# Patient Record
Sex: Female | Born: 1975 | Race: Black or African American | Hispanic: No | Marital: Married | Smoking: Never smoker
Health system: Southern US, Community
[De-identification: ages and names within clinical notes are randomized; demographics above are authoritative.]

## PROBLEM LIST (undated history)

## (undated) ENCOUNTER — Inpatient Hospital Stay: Payer: Self-pay

## (undated) DIAGNOSIS — Z8619 Personal history of other infectious and parasitic diseases: Secondary | ICD-10-CM

## (undated) DIAGNOSIS — Z789 Other specified health status: Secondary | ICD-10-CM

## (undated) DIAGNOSIS — E079 Disorder of thyroid, unspecified: Secondary | ICD-10-CM

## (undated) HISTORY — DX: Disorder of thyroid, unspecified: E07.9

## (undated) HISTORY — PX: MYOMECTOMY: SHX85

## (undated) HISTORY — PX: LEEP: SHX91

## (undated) HISTORY — DX: Personal history of other infectious and parasitic diseases: Z86.19

---

## 2009-02-03 ENCOUNTER — Emergency Department: Payer: Self-pay | Admitting: Emergency Medicine

## 2009-02-06 ENCOUNTER — Other Ambulatory Visit: Payer: Self-pay | Admitting: Emergency Medicine

## 2011-01-17 ENCOUNTER — Inpatient Hospital Stay: Payer: Self-pay | Admitting: Obstetrics and Gynecology

## 2014-06-16 ENCOUNTER — Encounter: Payer: Self-pay | Admitting: Maternal & Fetal Medicine

## 2014-08-04 ENCOUNTER — Encounter
Admit: 2014-08-04 | Disposition: A | Discharge status: Home / Self Care | Payer: Self-pay | Attending: Maternal & Fetal Medicine | Admitting: Maternal & Fetal Medicine

## 2014-08-14 NOTE — Consult Note (Signed)
Referral Information:  Reason for Referral 39 yo gravida 3 para 2002 at 5273w4d gestation is referred by [facility]  regarding the patient's history of stillbirth at [redacted] weeks gestation and abruption at 7236 weeks gestation.   Referring Physician [facility]   Prenatal Hx US 05/27/2014 - 647w5d EDD 01/01/2015; small fibroid   Past Obstetrical Hx 1. 07/29/2009 - spontaneous vaginal delivery of stillborn female fetus at [redacted] weeks gestation.  True knot in cord. [facility] 2. 01/17/2011 - LTCS 5 lb 6 oz female infant for abruption at [redacted] weeks gestation.  Multiple dense adhesions.  [facility]   Home Medications: Medication Instructions Status  prenatal vitmain 1 tab(s)  once a day while breastfeeding Active   Allergies:   No Known Allergies:   Vital Signs/Notes:  Nursing Vital Signs: **Vital Signs.:   03-Mar-16 09:11  Vital Signs Type Routine  Temperature Temperature (F) 98.3  Celsius 36.8  Pulse Pulse 86  Respirations Respirations 18  Systolic BP Systolic BP 121  Diastolic BP (mmHg) Diastolic BP (mmHg) 73  Mean BP 89  Pulse Ox % Pulse Ox % 100  Pulse Ox Activity Level  At rest  Oxygen Delivery Room Air/ 21 %   Perinatal Consult:  PGyn Hx fibroids  LEEP  Myomectomies 10/30/2009; LEEP 2008   Past Medical History cont'd Fibroids   PSurg Hx Abdominal laparotomy and myomectomies 10/30/2009 for large fibroids;   FHx Mother - HTN, fibroids; Father - HTN; MGM - still birth; FOBs oldest bro with multiple fractures; his children with multiple fractures   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 history of stillbirth at 4630 weeks gestation associated with true know, but also abruption at 8136 weeks gestation  -- APS labs today -- ASA 81 mg daily starting at [redacted] weeks gestation -- [facility], [facility] as outlined below 2. history of large myomectomies, previous LTCS with dense adhesions -- patient is aware she is not a candidate for TOLAC   Plan:  Prenatal Diagnosis Options Level II US, [facility] at [redacted] weeks gestation   Ultrasound at what gestational ages Monthly > 28 weeks   Antepartum Testing Weekly, starting at [redacted] weeks gestation; twice weekly starting at 8632 weeks gestation   Delivery Mode Cesarean   Delivery at what gestational age [redacted] weeks, or sooner prn   Comment/Plan Thank you for allowing us to participate in her care    Total Time Spent with Patient 45 minutes   >50% of visit spent in couseling/coordination of care yes   Office Use Only [address]  Level 3 (40min) NEW office consult detailed   Coding Description: FETAL - 2nd/3rd TRIMESTER INDICATION(S).   History of placental abruption.   Intrauterine fetal demise > 22 weeks.   MATERNAL CONDITIONS/HISTORY INDICATION(S).   Fibroids.  Electronic Signatures: Lady DeutscherJames, Nakiesha Rumsey (MD)  (Signed 03-Mar-16 18:05)  Authored: Referral, Home Medications, Allergies, Vital Signs/Notes, Consult, Exam, Impression, Plan, Billing, Coding Description  Last Updated: 03-Mar-16 18:05 by Lady Deutscher (MD)

## 2014-10-13 ENCOUNTER — Ambulatory Visit
Admission: RE | Admit: 2014-10-13 | Discharge: 2014-10-13 | Disposition: A | Discharge status: Home / Self Care | Payer: No Typology Code available for payment source | Source: Ambulatory Visit | Attending: Maternal & Fetal Medicine | Admitting: Maternal & Fetal Medicine

## 2014-10-13 DIAGNOSIS — O09523 Supervision of elderly multigravida, third trimester: Secondary | ICD-10-CM | POA: Insufficient documentation

## 2014-10-13 DIAGNOSIS — Z3A32 32 weeks gestation of pregnancy: Secondary | ICD-10-CM | POA: Insufficient documentation

## 2014-10-13 DIAGNOSIS — Z8759 Personal history of other complications of pregnancy, childbirth and the puerperium: Secondary | ICD-10-CM

## 2014-10-13 DIAGNOSIS — O4410 Placenta previa with hemorrhage, unspecified trimester: Secondary | ICD-10-CM | POA: Insufficient documentation

## 2014-10-13 DIAGNOSIS — Z36 Encounter for antenatal screening of mother: Secondary | ICD-10-CM | POA: Insufficient documentation

## 2014-10-13 LAB — US OB FOLLOW UP

## 2014-10-13 NOTE — Progress Notes (Signed)
Ultrasound report from 10/13/2014 (printer dysfunction today) Indication: AMA, h/o abruption at 36 weeks.  ____________________________________________________________________________ History: Age: 39 years. [facility] 39 [facility] ____________________________________________________________________________ Dating: LMP: 04/02/2014 EDC: 01/07/2015 GA by LMP: 3251w5d Current Scan on: 10/13/2014 EDC: 01/03/2015 GA by current scan: 1258w2d Best Overall Assessment: 08/04/2014 EDC: 01/01/2015 Assessed GA: 4024w4d The calculation of the gestational age by current scan was based on BPD, HC, AC, FL and HUM. The Best Overall Assessment is based on an earlier assessment on 05/27/2014. ____________________________________________________________________________ Anatomy Scan: Singleton gestation. Biometry: BPD 69.4 mm  - 2827w6d (9229w1d to 8124w4d) HC 254.4 mm  - 2236w4d (4239w4d to 7774w5d) AC 247.9 mm  - 6946w0d (5058w2d to 6374w5d) FL 54.2 mm  - 6531w5d (8361w4d to 22110w5d) HUM 48.7 mm  - 3658w2d EFW (lbs/oz) 2 lbs 13 ozs EFW (g) 1267 g 41st%   Fetal heart activity: present. Fetal heart rate: 149 bpm.  Fetal presentation: cephalic.  Amniotic fluid: normal. AFI  14.3 cm. MVP 6.8 cm.  Placenta: placenta previa - posterior.   Fetal Anatomy: Head: Appears normal.  Brain: Appears normal.  Face: Appears normal.  Spine: Appears normal Neck / Skin: visualized previously.  Thorax: Appears normal.  Heart: Appears normal.  Abdominal Wall: visualized previously.  Gastrointestinal Tract: Appears normal.  Kidneys / Adrenal Glands: Appears normal.  Bladder: Appears normal.  Genitalia: Female fetus.  Extremities: visualized previously.  Skeleton: visualized previously.  ____________________________________________________________________________ Maternal Structures: Cervical length 32 mm. ____________________________________________________________________________ Report Summary: Impression: Single intrauterine  pregnancy with an estimated gestational age of [redacted] weeks 4 day(s).  Dating assigned by earliest available ultrasound, [facility]/2014; measurements at that exam reported as  8 weeks 5 day(s).  The patient was  noted to have an isolated fetal choroid plexus cyst. She has had normal cell free fetal DNA testing.    Today, she returns to follow up fetal growth and placental location due to history of placenta previa in the setting of prior cesarean delivery.   [facility] due to her history of stillbirth at [redacted] weeks gestation associated with a true knot in the cord and another pregnancy complicated by abruption at [redacted] weeks gestation.  She also has a history of large myomectomies and previous LTCS with dense adhesions.  She has been previously counseled that she is not a candidate for labor.  We addressed the possiblity of increased blood loss at the time of delivery due to history of multiple uterine surgeries, and placental previa.    Both transbdominal and endovaginal ultraosunds were performed to complete the study.  The interval fetal growth is appropriate. The fetal efw is 1267g (47%).  The follow up fetal anatomy appears normal.  The previously seen choroid plexus cyst is no longer identified.   The anterior placenta is ~.5 cm from the internal cervical os--consistent with a marginal previa.  The utero-placental interface appears normal. Although a focal accreta cannot be excluded, we do not see sonographic evidence of an accreta.  We performed both transabdominal and transvaginal imaging (with full bladder) in order to image the placenta.    At this time, we do not have any evidence of placenta accreta.    Recommend follow up ultrasound for fetal growth in 4 weeks due to her history of IUFD, abruption and marginal previa in the current pregnancy.  We will reassess the placenta again at the time of her follow up ultrasound.  CODING DESCRIPTION:  previous iufd,  previous abruption, previous cesarean delivery, previous myomectomies, marginal plaenta previa.  Recommendations:   Thank you for allowing [place] to participate in her care.

## 2014-10-20 ENCOUNTER — Encounter: Payer: Self-pay | Admitting: *Deleted

## 2014-10-20 ENCOUNTER — Observation Stay
Admission: EM | Admit: 2014-10-20 | Discharge: 2014-10-20 | Disposition: A | Discharge status: Home / Self Care | Payer: No Typology Code available for payment source | Attending: Obstetrics & Gynecology | Admitting: Obstetrics & Gynecology

## 2014-10-20 DIAGNOSIS — Z3A29 29 weeks gestation of pregnancy: Secondary | ICD-10-CM | POA: Insufficient documentation

## 2014-10-20 DIAGNOSIS — O368130 Decreased fetal movements, third trimester, not applicable or unspecified: Principal | ICD-10-CM | POA: Insufficient documentation

## 2014-10-20 DIAGNOSIS — O479 False labor, unspecified: Secondary | ICD-10-CM | POA: Diagnosis present

## 2014-10-20 HISTORY — DX: Other specified health status: Z78.9

## 2014-10-20 NOTE — H&P (Signed)
Obstetrics Admission History & Physical   Non-stress Test   HPI:  39 y.o. G3P0100 @ 238w4d (01/01/2015, Alternate EDD Entry). Admitted on 10/20/2014:   Patient Active Problem List   Diagnosis Date Noted  . Irregular contractions 10/20/2014     Presents for NR NST in office, h/o abruption, DFM.Marland Kitchen.  [facility]  PMHx:  Past Medical History  Diagnosis Date  . Medical history non-contributory    PSHx:  Past Surgical History  Procedure Laterality Date  . Myomectomy     Medications:  Prescriptions prior to admission  Medication Sig Dispense Refill Last Dose  . aspirin 81 MG tablet Take 81 mg by mouth daily.   Taking  . Prenatal Vit-Fe Fumarate-FA (PRENATAL MULTIVITAMIN) TABS tablet Take 1 tablet by mouth daily at 12 noon.      Allergies: has No Known Allergies. OBHx:  OB History  Gravida Para Term Preterm AB SAB TAB Ectopic Multiple Living  3 2  1           # Outcome Date GA Lbr Len/2nd Weight Sex Delivery Anes PTL Lv  3 Current           2 Para 01/25/11    F CS-Unspec        Complications: Abruptio Placenta  1 Preterm 07/20/09    M Vag-Spont   FD     ZOX:[id number]/[id number]:Negative/unremarkable except as detailed in HPI. Soc Hx: Pregnancy welcomed  Objective:   Filed Vitals:   10/20/14 1625  BP: 121/74  Pulse: 73  Temp: 98.3 F (36.8 C)  Resp: 18   General: Well nourished, well developed female in no acute distress.  Skin: Warm and dry.  Cardiovascular:Regular rate and rhythm. Respiratory: Clear to auscultation bilateral. Normal respiratory effort Abdomen: no pain Neuro/Psych: Normal mood and affect.   EFM:FHR: 130 bpm, variability: moderate,  accelerations:  Present,  decelerations:  Absent Toco: None  Assessment & Plan:   39 y.o. G3P0100 @ 3638w4d, Admitted on 10/20/2014: NST REACTIVE.    Discharge Home and Fetal Wellbeing Reassuring

## 2014-10-20 NOTE — OB Triage Note (Signed)
Sent from office for nonreactive NST

## 2014-10-20 NOTE — Discharge Summary (Signed)
Patient discharged home, discharge instructions given, patient states understanding, pt left floor in stable condition, denies any other needs at this time

## 2014-10-20 NOTE — Discharge Summary (Signed)
Gynecology Discharge Summary Date of Admission: 10/20/2014 Date of Discharge: 10/20/2014  The patient was admitted, as scheduled, and underwent a NST procedure; [facility] for full details.   NST Reactive; reassuring.    Medication List    TAKE these medications        aspirin 81 MG tablet  Take 81 mg by mouth daily.     prenatal multivitamin Tabs tablet  Take 1 tablet by mouth daily at 12 noon.       Future Appointments Date Time Provider Department Center  11/10/2014 8:00 AM [facility] US 1 [facility]  Also, [facility]  Discharge Dx: h/o Abruption, DFM.  Annamarie MajorPaul TRUE Garciamartinez, MD [facility]

## 2014-10-21 ENCOUNTER — Observation Stay
Admission: EM | Admit: 2014-10-21 | Discharge: 2014-10-22 | Disposition: A | Discharge status: Home / Self Care | Payer: No Typology Code available for payment source | Attending: Obstetrics & Gynecology | Admitting: Obstetrics & Gynecology

## 2014-10-21 DIAGNOSIS — O368190 Decreased fetal movements, unspecified trimester, not applicable or unspecified: Principal | ICD-10-CM | POA: Insufficient documentation

## 2014-10-22 DIAGNOSIS — O368190 Decreased fetal movements, unspecified trimester, not applicable or unspecified: Secondary | ICD-10-CM | POA: Diagnosis not present

## 2014-10-22 NOTE — OB Triage Note (Signed)
Patient presented from ED stating she hasn't felt her baby move since around dinner time.

## 2014-11-10 ENCOUNTER — Ambulatory Visit
Admission: RE | Admit: 2014-11-10 | Discharge: 2014-11-10 | Disposition: A | Discharge status: Home / Self Care | Payer: No Typology Code available for payment source | Source: Ambulatory Visit | Attending: Maternal & Fetal Medicine | Admitting: Maternal & Fetal Medicine

## 2014-11-10 DIAGNOSIS — O09299 Supervision of pregnancy with other poor reproductive or obstetric history, unspecified trimester: Secondary | ICD-10-CM | POA: Diagnosis present

## 2014-11-10 DIAGNOSIS — O09529 Supervision of elderly multigravida, unspecified trimester: Secondary | ICD-10-CM

## 2014-11-10 DIAGNOSIS — O09523 Supervision of elderly multigravida, third trimester: Secondary | ICD-10-CM | POA: Diagnosis not present

## 2014-11-10 DIAGNOSIS — O09293 Supervision of pregnancy with other poor reproductive or obstetric history, third trimester: Secondary | ICD-10-CM | POA: Diagnosis not present

## 2014-11-10 DIAGNOSIS — Z3A32 32 weeks gestation of pregnancy: Secondary | ICD-10-CM | POA: Diagnosis not present

## 2014-11-10 LAB — US OB FOLLOW UP

## 2014-11-15 ENCOUNTER — Encounter
Admission: EM | Disposition: A | Discharge status: Home / Self Care | Payer: Self-pay | Source: Home / Self Care | Attending: Obstetrics & Gynecology

## 2014-11-15 ENCOUNTER — Inpatient Hospital Stay
Admission: EM | Admit: 2014-11-15 | Discharge: 2014-11-19 | DRG: 765 | Disposition: A | Discharge status: Home / Self Care | Payer: No Typology Code available for payment source | Attending: Obstetrics & Gynecology | Admitting: Obstetrics & Gynecology

## 2014-11-15 ENCOUNTER — Inpatient Hospital Stay: Payer: No Typology Code available for payment source | Admitting: Certified Registered Nurse Anesthetist

## 2014-11-15 ENCOUNTER — Encounter: Payer: Self-pay | Admitting: Anesthesiology

## 2014-11-15 ENCOUNTER — Inpatient Hospital Stay: Admit: 2014-11-15 | Payer: Self-pay | Admitting: Obstetrics and Gynecology

## 2014-11-15 DIAGNOSIS — Z98891 History of uterine scar from previous surgery: Secondary | ICD-10-CM

## 2014-11-15 DIAGNOSIS — O09523 Supervision of elderly multigravida, third trimester: Secondary | ICD-10-CM

## 2014-11-15 DIAGNOSIS — O4413 Placenta previa with hemorrhage, third trimester: Principal | ICD-10-CM | POA: Diagnosis present

## 2014-11-15 DIAGNOSIS — O3421 Maternal care for scar from previous cesarean delivery: Secondary | ICD-10-CM | POA: Diagnosis present

## 2014-11-15 DIAGNOSIS — Z3A33 33 weeks gestation of pregnancy: Secondary | ICD-10-CM | POA: Diagnosis present

## 2014-11-15 DIAGNOSIS — Z7982 Long term (current) use of aspirin: Secondary | ICD-10-CM | POA: Diagnosis not present

## 2014-11-15 DIAGNOSIS — O9902 Anemia complicating childbirth: Secondary | ICD-10-CM | POA: Diagnosis present

## 2014-11-15 LAB — CBC WITH DIFFERENTIAL/PLATELET
BASOS ABS: 0 10*3/uL (ref 0–0.1)
Basophils Relative: 0 %
EOS ABS: 0 10*3/uL (ref 0–0.7)
EOS PCT: 0 %
HCT: 26.1 % — ABNORMAL LOW (ref 35.0–47.0)
Hemoglobin: 8.7 g/dL — ABNORMAL LOW (ref 12.0–16.0)
Lymphocytes Relative: 6 %
Lymphs Abs: 0.7 10*3/uL — ABNORMAL LOW (ref 1.0–3.6)
MCH: 29.3 pg (ref 26.0–34.0)
MCHC: 33.4 g/dL (ref 32.0–36.0)
MCV: 87.9 fL (ref 80.0–100.0)
Monocytes Absolute: 1.2 10*3/uL — ABNORMAL HIGH (ref 0.2–0.9)
Monocytes Relative: 11 %
NEUTROS PCT: 83 %
Neutro Abs: 8.9 10*3/uL — ABNORMAL HIGH (ref 1.4–6.5)
Platelets: 241 10*3/uL (ref 150–440)
RBC: 2.96 MIL/uL — ABNORMAL LOW (ref 3.80–5.20)
RDW: 14.7 % — AB (ref 11.5–14.5)
WBC: 10.8 10*3/uL (ref 3.6–11.0)

## 2014-11-15 LAB — TYPE AND SCREEN
ABO/RH(D): B POS
ANTIBODY SCREEN: NEGATIVE

## 2014-11-15 LAB — ABO/RH: ABO/RH(D): B POS

## 2014-11-15 LAB — PROTIME-INR
INR: 1.05
Prothrombin Time: 13.9 seconds (ref 11.4–15.0)

## 2014-11-15 LAB — APTT: APTT: 25 s (ref 24–36)

## 2014-11-15 SURGERY — Surgical Case
Anesthesia: General | Site: Abdomen | Wound class: Clean Contaminated

## 2014-11-15 MED ORDER — SENNOSIDES-DOCUSATE SODIUM 8.6-50 MG PO TABS: 2.0000 | ORAL_TABLET | ORAL | Status: DC

## 2014-11-15 MED ORDER — OXYTOCIN 40 UNITS IN LACTATED RINGERS INFUSION - SIMPLE MED
62.5000 mL/h | INTRAVENOUS | Status: AC
  Administered 2014-11-15: 62.5 mL/h via INTRAVENOUS
  Filled 2014-11-15: qty 1000

## 2014-11-15 MED ORDER — LACTATED RINGERS IV SOLN
INTRAVENOUS | Status: DC | PRN
  Administered 2014-11-15: 15:00:00 via INTRAVENOUS

## 2014-11-15 MED ORDER — ZOLPIDEM TARTRATE 5 MG PO TABS: 5.0000 mg | ORAL_TABLET | Freq: Every evening | ORAL | Status: DC | PRN

## 2014-11-15 MED ORDER — OXYCODONE-ACETAMINOPHEN 5-325 MG PO TABS
2.0000 | ORAL_TABLET | ORAL | Status: DC | PRN
  Administered 2014-11-15 – 2014-11-18 (×6): 2 via ORAL
  Filled 2014-11-15 (×6): qty 2

## 2014-11-15 MED ORDER — LANOLIN HYDROUS EX OINT: 1.0000 "application " | TOPICAL_OINTMENT | CUTANEOUS | Status: DC | PRN

## 2014-11-15 MED ORDER — DIBUCAINE 1 % RE OINT: 1.0000 "application " | TOPICAL_OINTMENT | RECTAL | Status: DC | PRN

## 2014-11-15 MED ORDER — ACETAMINOPHEN 325 MG PO TABS: 650.0000 mg | ORAL_TABLET | ORAL | Status: DC | PRN

## 2014-11-15 MED ORDER — SIMETHICONE 80 MG PO CHEW
80.0000 mg | CHEWABLE_TABLET | ORAL | Status: DC
  Administered 2014-11-18 (×2): 80 mg via ORAL
  Filled 2014-11-15 (×2): qty 1

## 2014-11-15 MED ORDER — SUCCINYLCHOLINE CHLORIDE 20 MG/ML IJ SOLN
INTRAMUSCULAR | Status: DC | PRN
  Administered 2014-11-15: 100 mg via INTRAVENOUS

## 2014-11-15 MED ORDER — FENTANYL CITRATE (PF) 100 MCG/2ML IJ SOLN
25.0000 ug | INTRAMUSCULAR | Status: DC | PRN
  Administered 2014-11-15 (×4): 25 ug via INTRAVENOUS

## 2014-11-15 MED ORDER — DIPHENHYDRAMINE HCL 25 MG PO CAPS: 25.0000 mg | ORAL_CAPSULE | Freq: Four times a day (QID) | ORAL | Status: DC | PRN

## 2014-11-15 MED ORDER — SIMETHICONE 80 MG PO CHEW
80.0000 mg | CHEWABLE_TABLET | ORAL | Status: DC | PRN
  Filled 2014-11-15: qty 1

## 2014-11-15 MED ORDER — MORPHINE SULFATE 2 MG/ML IJ SOLN
1.0000 mg | INTRAMUSCULAR | Status: DC | PRN
  Administered 2014-11-15 (×3): 1 mg via INTRAVENOUS
  Filled 2014-11-15 (×3): qty 1

## 2014-11-15 MED ORDER — SODIUM CHLORIDE 0.9 % IJ SOLN
INTRAMUSCULAR | Status: DC | PRN
  Administered 2014-11-15: 10 mL via INTRAMUSCULAR

## 2014-11-15 MED ORDER — MENTHOL 3 MG MT LOZG: 1.0000 | LOZENGE | OROMUCOSAL | Status: DC | PRN

## 2014-11-15 MED ORDER — OXYCODONE HCL 5 MG PO TABS: 5.0000 mg | ORAL_TABLET | Freq: Once | ORAL | Status: DC | PRN

## 2014-11-15 MED ORDER — LACTATED RINGERS IV SOLN: INTRAVENOUS | Status: DC

## 2014-11-15 MED ORDER — PRENATAL MULTIVITAMIN CH
1.0000 | ORAL_TABLET | Freq: Every day | ORAL | Status: DC
  Administered 2014-11-16 – 2014-11-19 (×4): 1 via ORAL
  Filled 2014-11-15 (×4): qty 1

## 2014-11-15 MED ORDER — FENTANYL CITRATE (PF) 100 MCG/2ML IJ SOLN
INTRAMUSCULAR | Status: DC | PRN
  Administered 2014-11-15 (×3): 50 ug via INTRAVENOUS
  Administered 2014-11-15: 100 ug via INTRAVENOUS

## 2014-11-15 MED ORDER — OXYCODONE HCL 5 MG/5ML PO SOLN: 5.0000 mg | Freq: Once | ORAL | Status: DC | PRN

## 2014-11-15 MED ORDER — LACTATED RINGERS IV SOLN
INTRAVENOUS | Status: DC | PRN
  Administered 2014-11-15: 14:00:00 via INTRAVENOUS

## 2014-11-15 MED ORDER — PROPOFOL 10 MG/ML IV BOLUS
INTRAVENOUS | Status: DC | PRN
  Administered 2014-11-15: 100 mg via INTRAVENOUS

## 2014-11-15 MED ORDER — SIMETHICONE 80 MG PO CHEW
80.0000 mg | CHEWABLE_TABLET | Freq: Three times a day (TID) | ORAL | Status: DC
  Administered 2014-11-15 – 2014-11-19 (×11): 80 mg via ORAL
  Filled 2014-11-15 (×11): qty 1

## 2014-11-15 MED ORDER — WITCH HAZEL-GLYCERIN EX PADS: 1.0000 "application " | MEDICATED_PAD | CUTANEOUS | Status: DC | PRN

## 2014-11-15 MED ORDER — OXYCODONE-ACETAMINOPHEN 5-325 MG PO TABS
1.0000 | ORAL_TABLET | ORAL | Status: DC | PRN
  Administered 2014-11-16 – 2014-11-18 (×3): 1 via ORAL
  Filled 2014-11-15 (×3): qty 1

## 2014-11-15 MED ORDER — OXYTOCIN 40 UNITS IN LACTATED RINGERS INFUSION - SIMPLE MED
INTRAVENOUS | Status: AC
  Administered 2014-11-15 (×2): 1000 mL via INTRAVENOUS
  Filled 2014-11-15: qty 1000

## 2014-11-15 MED ORDER — CEFAZOLIN SODIUM-DEXTROSE 2-3 GM-% IV SOLR
INTRAVENOUS | Status: DC | PRN
  Administered 2014-11-15: 2 g via INTRAVENOUS

## 2014-11-15 SURGICAL SUPPLY — 33 items
CANISTER SUCT 3000ML (MISCELLANEOUS) ×2 IMPLANT
CATH KIT ON-Q SILVERSOAK 5IN (CATHETERS) ×4 IMPLANT
CHLORAPREP W/TINT 26ML (MISCELLANEOUS) ×4 IMPLANT
DRSG TELFA 3X8 NADH (GAUZE/BANDAGES/DRESSINGS) ×2 IMPLANT
ELECT CAUTERY BLADE 6.4 (BLADE) ×2 IMPLANT
GAUZE SPONGE 4X4 12PLY STRL (GAUZE/BANDAGES/DRESSINGS) ×2 IMPLANT
GLOVE SKINSENSE NS SZ8.0 LF (GLOVE) ×1
GLOVE SKINSENSE STRL SZ8.0 LF (GLOVE) ×1 IMPLANT
GOWN STRL REUS W/ TWL LRG LVL3 (GOWN DISPOSABLE) ×1 IMPLANT
GOWN STRL REUS W/ TWL XL LVL3 (GOWN DISPOSABLE) ×2 IMPLANT
GOWN STRL REUS W/TWL LRG LVL3 (GOWN DISPOSABLE) ×1
GOWN STRL REUS W/TWL XL LVL3 (GOWN DISPOSABLE) ×2
LIQUID BAND (GAUZE/BANDAGES/DRESSINGS) ×2 IMPLANT
NDL SAFETY ECLIPSE 18X1.5 (NEEDLE) ×1 IMPLANT
NEEDLE HYPO 18GX1.5 SHARP (NEEDLE) ×1
NS IRRIG 1000ML POUR BTL (IV SOLUTION) ×2 IMPLANT
PACK C SECTION AR (MISCELLANEOUS) ×2 IMPLANT
PAD GROUND ADULT SPLIT (MISCELLANEOUS) ×2 IMPLANT
PAD OB MATERNITY 4.3X12.25 (PERSONAL CARE ITEMS) ×2 IMPLANT
PAD PREP 24X41 OB/GYN DISP (PERSONAL CARE ITEMS) ×2 IMPLANT
PENCIL ELECTRO HAND CTR (MISCELLANEOUS) ×2 IMPLANT
SLEEVE PROTECTION STRL DISP (MISCELLANEOUS) ×4 IMPLANT
SPONGE LAP 18X18 5 PK (GAUZE/BANDAGES/DRESSINGS) ×2 IMPLANT
SUT MAXON ABS #0 GS21 30IN (SUTURE) ×4 IMPLANT
SUT PLAIN 2 0 XLH (SUTURE) ×2 IMPLANT
SUT VIC AB 1 CT1 36 (SUTURE) ×2 IMPLANT
SUT VIC AB 2-0 CT1 36 (SUTURE) ×2 IMPLANT
SUT VIC AB 2-0 SH 27 (SUTURE) ×1
SUT VIC AB 2-0 SH 27XBRD (SUTURE) ×1 IMPLANT
SUT VIC AB 3-0 SH 27 (SUTURE) ×2
SUT VIC AB 3-0 SH 27X BRD (SUTURE) ×2 IMPLANT
SUT VIC AB 4-0 FS2 27 (SUTURE) ×2 IMPLANT
SYRINGE 10CC LL (SYRINGE) ×2 IMPLANT

## 2014-11-15 NOTE — Progress Notes (Signed)
Admit Date: 11/15/2014 Today's Date: 11/15/2014  Subjective: Postpartum Day 0: Cesarean Delivery for previa at 33 weeks Patient reports incisional pain.    Objective: Vital signs in last 24 hours: Temp:  [98.4 F (36.9 C)-99.1 F (37.3 C)] 99.1 F (37.3 C) (08/02 1715) Pulse Rate:  [71-134] 78 (08/02 1715) Resp:  [12-20] 14 (08/02 1715) BP: (125-133)/(79-91) 126/79 mmHg (08/02 1715) SpO2:  [99 %-100 %] 100 % (08/02 1715)  Physical Exam:  General: alert and cooperative Lochia: appropriate Uterine Fundus: firm Incision: dressing dry DVT Evaluation: No evidence of DVT seen on physical exam.  Assessment/Plan: Status post Cesarean section. Doing well postoperatively.  Continue current care. Foley for 3 days for small cystotomy repair as part of procedure. Desires BTL which could not be done at CS, nor was it planned due to emergent nature of delivery.  Rec Laparoscopic, or other (vasec, depo). Monitor blood count. Infant in SCN, doing well at this time.  Samreet Edenfield PAUL 11/15/2014, 5:37 PM

## 2014-11-15 NOTE — Anesthesia Procedure Notes (Signed)
Procedure Name: Intubation Performed by: Malva Cogan Pre-anesthesia Checklist: Patient identified, Emergency Drugs available, Timeout performed, Patient being monitored and Suction available Patient Re-evaluated:Patient Re-evaluated prior to inductionOxygen Delivery Method: Circle system utilized Preoxygenation: Pre-oxygenation with 100% oxygen Intubation Type: IV induction, Cricoid Pressure applied and Rapid sequence Laryngoscope Size: McGraph and 3 Tube type: Oral Tube size: 7.0 mm Number of attempts: 2 Airway Equipment and Method: Stylet Placement Confirmation: positive ETCO2,  ETT inserted through vocal cords under direct vision and breath sounds checked- equal and bilateral Secured at: 23 cm Tube secured with: Tape Dental Injury: Teeth and Oropharynx as per pre-operative assessment

## 2014-11-15 NOTE — Op Note (Signed)
Cesarean Section Procedure Note Indications: placenta previa, rapid onset hemorrhage, prior cesarean section and pre- term intrauterine pregnancy  Pre-operative Diagnosis: Intrauterine pregnancy [redacted]w[redacted]d ;  placenta previa, prior cesarean section and pre- term intrauterine pregnancy Post-operative Diagnosis: same, delivered. Procedure: Low Transverse Cesarean Section Surgeon: Annamarie Major, MD, FACOG Assistant(s): Ward Anesthesia: General endotracheal anesthesia Estimated Blood Loss:700 mL Complications: None; patient tolerated the procedure well. Disposition: PACU - hemodynamically stable. Condition: stable  Findings: A female infant in the cephalic presentation. Amniotic fluid - Clear  Birth weight 4-8 lbs.  Apgars of 8 and 8.  Intact placenta with a three-vessel cord. Grossly normal uterus, tubes and ovaries bilaterally. Marked intraabdominal adhesions were noted.  Unable to externalize uterus.  Procedure Details   The patient was taken to Operating Room, identified as the correct patient and the procedure verified as C-Section Delivery. A Time Out was held and the above information confirmed. After induction of anesthesia, the patient was draped and prepped in the usual sterile manner. A Pfannenstiel incision was made and carried down through the subcutaneous tissue to the fascia. Fascial incision was made and extended transversely with the Mayo scissors. The fascia was separated from the underlying rectus tissue superiorly and inferiorly. The peritoneum was identified and entered bluntly. Peritoneal incision was extended longitudinally. The utero-vesical peritoneal reflection was incised transversely and a bladder flap was created digitally.  A low transverse hysterotomy was made. The fetus was delivered atraumatically. The umbilical cord was clamped x2 and cut and the infant was handed to the awaiting pediatricians. The placenta was removed intact and appeared normal with a 3-vessel cord.   The uterus was unable to be exteriorized due to adhesions,  and it was then cleared of all clot and debris. The hysterotomy was closed with running sutures of 0 Vicryl suture. A second imbricating layer was placed with the same suture. Excellent hemostasis was observed. The uterus was returned to the abdomen. The pelvis was irrigated and again, excellent hemostasis was noted.  Small cystotomy was repaired with 3 layer closure using 3-0 Vicryl suture.  Bladder is filled with methylene blue with no leak. The rectus fascia was then reapproximated with running sutures of Maxon. Subcutaneous tissues are then irrigated with saline and hemostasis assured.  SubQ layer closed w plain gut suture and skin with surgical staples. Instrument, sponge, and needle counts were correct prior to the abdominal closure and at the conclusion of the case.  The patient tolerated the procedure well and was transferred to the recovery room in stable condition.

## 2014-11-15 NOTE — Transfer of Care (Signed)
Immediate Anesthesia Transfer of Care Note  Patient: Connie Smith  Procedure(s) Performed: Procedure(s): CESAREAN SECTION (N/A)  Patient Location: PACU  Anesthesia Type:General  Level of Consciousness: awake, alert  and oriented  Airway & Oxygen Therapy: Patient Spontanous Breathing and Patient connected to face mask oxygen  Post-op Assessment: Report given to RN and Post -op Vital signs reviewed and stable  Post vital signs: Reviewed and stable  Last Vitals:  Filed Vitals:   11/15/14 1600  BP: 133/85  Pulse: 90  Temp: 36.9 C  Resp: 15    Complications: No apparent anesthesia complications

## 2014-11-15 NOTE — Discharge Summary (Signed)
Obstetrical Discharge Summary  Date of Admission: 11/15/2014 Date of Discharge: 11/19/14  Discharge Diagnosis: Placenta previa and hemorrhage, Preterm Delivery (33 weeks).  Prior Cesarean Section. Primary OB:  [facility]   Gestational Age at Delivery: [redacted]w[redacted]d  Antepartum complications: Placenta Previa. Date of Delivery: 11/15/14   Delivered By: Tiburcio Pea Delivery Type: repeat cesarean section, low transverse incision Intrapartum complications/course: Placenta Previa Anesthesia: general Placenta: spontaneous Laceration: none Episiotomy: none Live born female  Birth Weight: 4 lb 8.3 oz (2050 g) APGAR: 8, 8   Post partum course: Since the delivery, patient has tolerate activity, diet, and daily functions without difficulty or complication.  Min lochia.  No breast concerns at this time.  No signs of depression currently.   Postpartum Exam:General appearance: alert and cooperative GI: soft, non-tender; bowel sounds normal; no masses,  no organomegaly Extremities: extremities normal, atraumatic, no cyanosis or edema Incision: well-approximated, staples to be removed prior to discharge  Disposition: home without infant Rh Immune globulin given: not applicable Rubella vaccine given: not applicable Varicella vaccine given: not applicable Tdap vaccine given in AP or PP setting: given during prenatal care Flu vaccine given in AP or PP setting: not applicable Contraception: bilateral tubal ligation - to be scheduled at a later time  Prenatal Labs: B+//Rubella Immune/Varicella Immune/RPR negative//HIV negative/HepB Surface Ag negative//plans to breastfeed  Plan:  Connie Smith was discharged to home in good condition. Follow-up appointment with [facility] provider in 1 week for incision check  Discharge Medications: Prenatal Vitamins Iron Percocet   Discharge instructions:   Call office if you have any of the following: headache, visual changes, fever >100 F, chills, breast concerns, excessive  vaginal bleeding, incision drainage or problems, leg pain or redness, depression or any other concerns.   Activity: Do not lift > 10 lbs for 6 weeks.  No intercourse or tampons for 6 weeks.  No driving for 1-2 weeks.

## 2014-11-15 NOTE — H&P (Signed)
Obstetrics Admission History & Physical   No chief complaint on file.   HPI:  39 y.o. G3P0101 @ [redacted]w[redacted]d (01/01/2015, by Ultrasound). Admitted on 11/15/2014:   Patient Active Problem List   Diagnosis Date Noted  . Labor and delivery, indication for care 10/22/2014  . Irregular contractions 10/20/2014     Presents for [employer].  Prenatal care at: at [facility]  PMHx:  Past Medical History  Diagnosis Date  . Medical history non-contributory    PSHx:  Past Surgical History  Procedure Laterality Date  . Myomectomy     Medications:  Prescriptions prior to admission  Medication Sig Dispense Refill Last Dose  . aspirin 81 MG tablet Take 81 mg by mouth daily.   Taking  . Prenatal Vit-Fe Fumarate-FA (PRENATAL MULTIVITAMIN) TABS tablet Take 1 tablet by mouth daily at 12 noon.   Taking   Allergies: has No Known Allergies. OBHx:  OB History  Gravida Para Term Preterm AB SAB TAB Ectopic Multiple Living  3 2 0 1 0 0    1    # Outcome Date GA Lbr Len/2nd Weight Sex Delivery Anes PTL Lv  3 Current           2 Para 01/25/11    F CS-Unspec        Complications: Abruptio Placenta  1 Preterm 07/20/09    M Vag-Spont   FD     ZOX:WRUEAVWU/[id number] except as detailed in HPI. Soc Hx: Never smoker  Objective:  There were no vitals filed for this visit. General: Well nourished, well developed female in no acute distress.  Skin: Warm and dry.  Cardiovascular:Regular rate and rhythm. Respiratory: Clear to auscultation bilateral. Normal respiratory effort Abdomen: moderate Neuro/Psych: Normal mood and affect.   Pelvic exam: is not limited by body habitus EGBUS: within normal limits Vagina: within normal limits and with  blood in the vault Cervix: not evaluated Uterus: Uterus demonstrates irritability pattern.  Adnexa: not evaluated  EFM:FHR: 140 bpm, variability: moderate,  accelerations:  Present,  decelerations:  Absent Toco: Frequency: Every few minutes   Perinatal info:   Blood type: B positive Rubella- Immune Varicella -Immune TDaP Given during third trimester of this pregnancy RPR NR / HIV Neg/ HBsAg Neg   Assessment & Plan:   39 y.o. G3P0101 @ [redacted]w[redacted]d, Admitted on 11/15/2014: PLACENTA PREVIA w HEAVY BLEEDING at 33 2/[redacted] weeks EGA.  For immediate CS.  [facility] Reassuring.     CS now, Fetal Wellbeing Reassuring and GBS status neg, treat as needed

## 2014-11-15 NOTE — Anesthesia Preprocedure Evaluation (Addendum)
Anesthesia Evaluation  Patient identified by MRN, date of birth, ID band Patient awake    Reviewed: Allergy & Precautions, H&P , NPO status , Patient's Chart, lab work & pertinent test results, reviewed documented beta blocker date and time   Airway Mallampati: II  TM Distance: >3 FB Neck ROM: full    Dental no notable dental hx. (+) Teeth Intact   Pulmonary neg pulmonary ROS,  breath sounds clear to auscultation  Pulmonary exam normal       Cardiovascular negative cardio ROS Normal cardiovascular examRhythm:regular Rate:Normal     Neuro/Psych negative neurological ROS  negative psych ROS   GI/Hepatic negative GI ROS, Neg liver ROS,   Endo/Other  negative endocrine ROS  Renal/GU negative Renal ROS  negative genitourinary   Musculoskeletal   Abdominal   Peds  Hematology negative hematology ROS (+)   Anesthesia Other Findings Last ate at 1200  Placenta previa actively hemorrhaging   Reproductive/Obstetrics (+) Pregnancy                            Anesthesia Physical Anesthesia Plan  ASA: IV and emergent  Anesthesia Plan: General ETT   Post-op Pain Management:    Induction:   Airway Management Planned:   Additional Equipment:   Intra-op Plan:   Post-operative Plan:   Informed Consent: I have reviewed the patients History and Physical, chart, labs and discussed the procedure including the risks, benefits and alternatives for the proposed anesthesia with the patient or authorized representative who has indicated his/her understanding and acceptance.   Dental Advisory Given  Plan Discussed with: Anesthesiologist, CRNA and Surgeon  Anesthesia Plan Comments:         Anesthesia Quick Evaluation

## 2014-11-16 ENCOUNTER — Encounter: Payer: Self-pay | Admitting: Obstetrics & Gynecology

## 2014-11-16 LAB — CBC
HCT: 25 % — ABNORMAL LOW (ref 35.0–47.0)
Hemoglobin: 8.3 g/dL — ABNORMAL LOW (ref 12.0–16.0)
MCH: 29.5 pg (ref 26.0–34.0)
MCHC: 33.4 g/dL (ref 32.0–36.0)
MCV: 88.4 fL (ref 80.0–100.0)
PLATELETS: 230 10*3/uL (ref 150–440)
RBC: 2.83 MIL/uL — ABNORMAL LOW (ref 3.80–5.20)
RDW: 14.6 % — AB (ref 11.5–14.5)
WBC: 8.6 10*3/uL (ref 3.6–11.0)

## 2014-11-16 MED ORDER — FERROUS SULFATE 325 (65 FE) MG PO TABS
325.0000 mg | ORAL_TABLET | Freq: Two times a day (BID) | ORAL | Status: DC
  Administered 2014-11-16 – 2014-11-19 (×6): 325 mg via ORAL
  Filled 2014-11-16 (×6): qty 1

## 2014-11-16 MED ORDER — VITAMIN C 500 MG PO TABS
500.0000 mg | ORAL_TABLET | Freq: Two times a day (BID) | ORAL | Status: DC
  Administered 2014-11-16 – 2014-11-19 (×7): 500 mg via ORAL
  Filled 2014-11-16 (×9): qty 1

## 2014-11-16 MED ORDER — LACTATED RINGERS IV SOLN
INTRAVENOUS | Status: DC
  Administered 2014-11-16 (×2): via INTRAVENOUS

## 2014-11-16 MED ORDER — DOCUSATE SODIUM 100 MG PO CAPS
100.0000 mg | ORAL_CAPSULE | Freq: Two times a day (BID) | ORAL | Status: DC
  Administered 2014-11-16 – 2014-11-19 (×7): 100 mg via ORAL
  Filled 2014-11-16 (×7): qty 1

## 2014-11-16 NOTE — Progress Notes (Signed)
POD #1 S/P repeat Cesarean section for placenta previa Subjective:  Feels OK. Sore. Tired. Pumping breasts. Denies nausea.    Objective:  Blood pressure 131/93, pulse 87, temperature 98.7 F (37.1 C), temperature source Oral, resp. rate 18, height 5' (1.524 m), weight 168 lb (76.204 kg), last menstrual period 04/02/2014, SpO2 98 %.  General: NAD Pulmonary: no increased work of breathing, CTA Abdomen: non-distended, appropriately tender, fundus firm at level of umbilicus-1, hypoactive bowel sounds Incision: dressing C+D+I Extremities: no edema, no erythema, no tenderness  Results for orders placed or performed during the hospital encounter of 11/15/14 (from the past 72 hour(s))  APTT     Status: None   Collection Time: 11/15/14  2:47 PM  Result Value Ref Range   aPTT 25 24 - 36 seconds  Protime-INR     Status: None   Collection Time: 11/15/14  2:47 PM  Result Value Ref Range   Prothrombin Time 13.9 11.4 - 15.0 seconds   INR 1.05   CBC with Differential/Platelet     Status: Abnormal   Collection Time: 11/15/14  7:36 PM  Result Value Ref Range   WBC 10.8 3.6 - 11.0 K/uL   RBC 2.96 (L) 3.80 - 5.20 MIL/uL   Hemoglobin 8.7 (L) 12.0 - 16.0 g/dL   HCT 82.9 (L) 56.2 - 13.0 %   MCV 87.9 80.0 - 100.0 fL   MCH 29.3 26.0 - 34.0 pg   MCHC 33.4 32.0 - 36.0 g/dL   RDW 86.5 (H) 78.4 - 69.6 %   Platelets 241 150 - 440 K/uL   Neutrophils Relative % 83 %   Neutro Abs 8.9 (H) 1.4 - 6.5 K/uL   Lymphocytes Relative 6 %   Lymphs Abs 0.7 (L) 1.0 - 3.6 K/uL   Monocytes Relative 11 %   Monocytes Absolute 1.2 (H) 0.2 - 0.9 K/uL   Eosinophils Relative 0 %   Eosinophils Absolute 0.0 0 - 0.7 K/uL   Basophils Relative 0 %   Basophils Absolute 0.0 0 - 0.1 K/uL  Type and screen     Status: None   Collection Time: 11/15/14  8:05 PM  Result Value Ref Range   ABO/RH(D) B POS    Antibody Screen NEG    Sample Expiration 11/18/2014   ABO/Rh     Status: None   Collection Time: 11/15/14  8:06 PM  Result  Value Ref Range   ABO/RH(D) B POS   CBC     Status: Abnormal   Collection Time: 11/16/14  4:43 AM  Result Value Ref Range   WBC 8.6 3.6 - 11.0 K/uL   RBC 2.83 (L) 3.80 - 5.20 MIL/uL   Hemoglobin 8.3 (L) 12.0 - 16.0 g/dL   HCT 29.5 (L) 28.4 - 13.2 %   MCV 88.4 80.0 - 100.0 fL   MCH 29.5 26.0 - 34.0 pg   MCHC 33.4 32.0 - 36.0 g/dL   RDW 44.0 (H) 10.2 - 72.5 %   Platelets 230 150 - 440 K/uL     Assessment:   39 y.o. G3P0101 postoperativeday #1-stable  Plan:  1)Chronic anemia - hemodynamically stable and asymptomatic - po ferrous sulfate  2) --/--/B POS (08/02 2006) /   / Varicella Immune/ Rubella Immune  3. Breast feeding-continue to pump. Baby in NICU-can visit in wheelchair today.  4. Foley will need to stay in x2 more days due to cystotomy  5. Ambulate with assist  6 PO analgesics  Connie Smith, CNM

## 2014-11-17 LAB — CBC
HCT: 24.7 % — ABNORMAL LOW (ref 35.0–47.0)
Hemoglobin: 8.1 g/dL — ABNORMAL LOW (ref 12.0–16.0)
MCH: 29.5 pg (ref 26.0–34.0)
MCHC: 32.8 g/dL (ref 32.0–36.0)
MCV: 89.9 fL (ref 80.0–100.0)
Platelets: 236 10*3/uL (ref 150–440)
RBC: 2.75 MIL/uL — AB (ref 3.80–5.20)
RDW: 14.9 % — ABNORMAL HIGH (ref 11.5–14.5)
WBC: 7.8 10*3/uL (ref 3.6–11.0)

## 2014-11-17 LAB — SURGICAL PATHOLOGY

## 2014-11-17 NOTE — Progress Notes (Signed)
  Post-operative Day 2  Subjective: no complaints, up ad lib and tolerating PO  Catheter in place  Objective: Blood pressure 106/78, pulse 84, temperature 98 F (36.7 C), temperature source Oral, resp. rate 18, height 5' (1.524 m), weight 168 lb (76.204 kg), last menstrual period 04/02/2014, SpO2 100 %.  Physical Exam:  General: alert and cooperative Lochia: appropriate Uterine Fundus: firm Incision: occlusive dressing in place DVT Evaluation: No evidence of DVT seen on physical exam. Abdomen: soft, NT   Recent Labs  11/16/14 0443 11/17/14 0555  HGB 8.3* 8.1*  HCT 25.0* 24.7*    Assessment POD #2, s/p LTCS at 33 wks for placenta previa with active bleeding, cystotomy repair with foley in place until POD #3  Plan: Continue PO care and Advance activity as tolerated  D/c foley tomorrow   Feeding: pumping Contraception: BTL - interval d/t emergent c-section  RI/VI TDAP UTD Blood Type: B+   Marta Antu, [place] 11/17/2014, 12:44 PM

## 2014-11-17 NOTE — Lactation Note (Signed)
This note was copied from the chart of Connie Smith. Lactation Consultation Note  Patient Name: Connie Amalya Salmons [id number] Date: 11/17/2014 Reason for consult: Follow-up assessment   Maternal Data   Mother is pumping every three hours and is now making more drops of milk. She does not want to breast feed just to pump.  Feeding Feeding Type: Formula Length of feed: 30 min  LATCH Score/Interventions                      Lactation Tools Discussed/Used Tools: Pump   Consult Status      Trudee Grip 11/17/2014, 4:49 PM

## 2014-11-17 NOTE — Plan of Care (Signed)
Problem: Phase I Progression Outcomes Goal: Voiding adequately Outcome: Not Met (add Reason) Pt has foley

## 2014-11-17 NOTE — Anesthesia Postprocedure Evaluation (Signed)
  Anesthesia Post-op Note  Patient: Connie Smith  Procedure(s) Performed: Procedure(s): CESAREAN SECTION (N/A)  Anesthesia type:General ETT  Patient location: Floor  Post pain: Pain level controlled  Post assessment: Post-op Vital signs reviewed, Patient's Cardiovascular Status Stable, Respiratory Function Stable, Patent Airway and No signs of Nausea or vomiting  Post vital signs: Reviewed and stable  Last Vitals:  Filed Vitals:   11/17/14 1205  BP: 106/78  Pulse: 84  Temp: 36.7 C  Resp: 18    Level of consciousness: awake, alert  and patient cooperative  Complications: No apparent anesthesia complications

## 2014-11-18 NOTE — Progress Notes (Signed)
POD #3 s/p Cesarean section for placenta previa/bleeding and repair of cystotomy Subjective:   Feels well. Denies lightheadedness when OOB. Tolerating a regular diet and passing flatus. Baby being fed with NG  Tube. Patient pumping. Good urine output via foley  Objective:  Blood pressure 126/81, pulse 92, temperature 98.3 F (36.8 C), temperature source Oral, resp. rate 16, height 5' (1.524 m), weight 168 lb (76.204 kg), last menstrual period 04/02/2014, SpO2 100 %.  General: NAD Heart: RRR without murmur Pulmonary: no increased work of breathing/ CTA Abdomen: non-distended, non-tender, BS active fundus firm at level of umbilicus-2/ tender/ML Incision: C+D+staples intact Extremities:  no erythema, no tenderness  Results for orders placed or performed during the hospital encounter of 11/15/14 (from the past 72 hour(s))  APTT     Status: None   Collection Time: 11/15/14  2:47 PM  Result Value Ref Range   aPTT 25 24 - 36 seconds  Protime-INR     Status: None   Collection Time: 11/15/14  2:47 PM  Result Value Ref Range   Prothrombin Time 13.9 11.4 - 15.0 seconds   INR 1.05   Surgical pathology     Status: None   Collection Time: 11/15/14  3:00 PM  Result Value Ref Range   SURGICAL PATHOLOGY      Surgical Pathology CASE: [id number] PATIENT: Connie Smith Surgical Pathology Report     SPECIMEN SUBMITTED: A. Placenta, third trimester  CLINICAL HISTORY: [redacted]w[redacted]d  PRE-OPERATIVE DIAGNOSIS: Placenta previa  POST-OPERATIVE DIAGNOSIS: Placenta previa     DIAGNOSIS: A PLACENTA; CESAREAN SECTION: - THIRD TRIMESTER SINGLETON PLACENTA, 332 GRAMS (>10TH PERCENTILE FOR ESTIMATED GESTATIONAL AGE OF 33 2/7 WEEKS). -THREE-VESSEL UMBILICAL CORD WITH ECCENTRIC INSERTION. - NEGATIVE FOR FUNISITIS AND CHORIOAMNIONITIS.   GROSS DESCRIPTION:  A. The specimen is received in a formalin-filled container labeled with the patient's name and placenta.  Weight: 332 grams Dimensions:  17.5 x 14.5 by up to 2 cm Shape: irregular Accessory lobes: none Membranes: smooth purple tan translucent Umbilical cord:      Length -11.5 cm in length x 1.8 cm in diameter      Number of vessels -3      Insertion -eccentric      Distance of insertion from margin -4.6 cm      Other fi ndings -none noted Fetal surface: granular blue-gray with prominent vessels Maternal surface: torn and cannot be evaluated for completeness Comments: none noted  Block summary: 1 - umbilical cord 2 - membrane rolls 3-4 - placental parenchyma   Final Diagnosis performed by Glenice Bow, MD.  Electronically signed 11/17/2014 10:28:30AM    The electronic signature indicates that the named Attending Pathologist has evaluated the specimen  Technical component performed at [facility], [address] [place], [place] [address] Lab: [phone] Dir: Titus Dubin. Cato Mulligan, MD  Professional component performed at [facility], [facility], [address] [place], [place], [place] [address] Lab: [phone] Dir: Georgiann Cocker. Rubinas, MD    CBC with Differential/Platelet     Status: Abnormal   Collection Time: 11/15/14  7:36 PM  Result Value Ref Range   WBC 10.8 3.6 - 11.0 K/uL   RBC 2.96 (L) 3.80 - 5.20 MIL/uL   Hemoglobin 8.7 (L) 12.0 - 16.0 g/dL   HCT 84.1 (L) 32.4 - 40.1 %   MCV 87.9 80.0 - 100.0 fL   MCH 29.3 26.0 - 34.0 pg   MCHC 33.4 32.0 - 36.0 g/dL   RDW 02.7 (H) 25.3 - 66.4 %   Platelets  241 150 - 440 K/uL   Neutrophils Relative % 83 %   Neutro Abs 8.9 (H) 1.4 - 6.5 K/uL   Lymphocytes Relative 6 %   Lymphs Abs 0.7 (L) 1.0 - 3.6 K/uL   Monocytes Relative 11 %   Monocytes Absolute 1.2 (H) 0.2 - 0.9 K/uL   Eosinophils Relative 0 %   Eosinophils Absolute 0.0 0 - 0.7 K/uL   Basophils Relative 0 %   Basophils Absolute 0.0 0 - 0.1 K/uL  Type and screen     Status: None   Collection Time: 11/15/14  8:05 PM  Result Value Ref Range   ABO/RH(D) B POS    Antibody Screen NEG    Sample  Expiration 11/18/2014   ABO/Rh     Status: None   Collection Time: 11/15/14  8:06 PM  Result Value Ref Range   ABO/RH(D) B POS   CBC     Status: Abnormal   Collection Time: 11/16/14  4:43 AM  Result Value Ref Range   WBC 8.6 3.6 - 11.0 K/uL   RBC 2.83 (L) 3.80 - 5.20 MIL/uL   Hemoglobin 8.3 (L) 12.0 - 16.0 g/dL   HCT 16.1 (L) 09.6 - 04.5 %   MCV 88.4 80.0 - 100.0 fL   MCH 29.5 26.0 - 34.0 pg   MCHC 33.4 32.0 - 36.0 g/dL   RDW 40.9 (H) 81.1 - 91.4 %   Platelets 230 150 - 440 K/uL  CBC     Status: Abnormal   Collection Time: 11/17/14  5:55 AM  Result Value Ref Range   WBC 7.8 3.6 - 11.0 K/uL   RBC 2.75 (L) 3.80 - 5.20 MIL/uL   Hemoglobin 8.1 (L) 12.0 - 16.0 g/dL   HCT 78.2 (L) 95.6 - 21.3 %   MCV 89.9 80.0 - 100.0 fL   MCH 29.5 26.0 - 34.0 pg   MCHC 32.8 32.0 - 36.0 g/dL   RDW 08.6 (H) 57.8 - 46.9 %   Platelets 236 150 - 440 K/uL     Assessment:   39 y.o. G3P0101 postoperativeday # 3-stable  Plan:  1) Chronic anemia - hemodynamically stable and asymptomatic - continue po ferrous sulfate/ vitamins  2) s/p cystotomy and repair-discontinue foley and trial of voiding  3.) Discontinue saline lock  3.) Probable discharge tomorrow  Farrel Conners, CNM

## 2014-11-19 MED ORDER — FERROUS SULFATE 325 (65 FE) MG PO TABS: 325.0000 mg | ORAL_TABLET | Freq: Two times a day (BID) | ORAL | Status: DC

## 2014-11-19 MED ORDER — OXYCODONE-ACETAMINOPHEN 5-325 MG PO TABS: 1.0000 | ORAL_TABLET | ORAL | Status: DC | PRN

## 2014-11-19 NOTE — Progress Notes (Signed)
Pt discharged home without infant, who is in SCN.  Discharge instructions and follow up appointment given to and reviewed with pt.  Pt verbalized understanding.  Escorted by nursing staff.

## 2014-11-19 NOTE — Discharge Instructions (Signed)
Discharge instructions:  ° °Call office if you have any of the following: headache, visual changes, fever >100 F, chills, breast concerns, excessive vaginal bleeding, incision drainage or problems, leg pain or redness, depression or any other concerns.  ° °Activity: Do not lift > 10 lbs for 6 weeks.  °No intercourse or tampons for 6 weeks.  °No driving for 1-2 weeks.  ° °Cesarean Delivery, Care After °Refer to this sheet in the next few weeks. These instructions provide you with information on caring for yourself after your procedure. Your health care provider may also give you specific instructions. Your treatment has been planned according to current medical practices, but problems sometimes occur. Call your health care provider if you have any problems or questions after you go home. °HOME CARE INSTRUCTIONS  °· Only take over-the-counter or prescription medications as directed by your health care provider. °· Do not drink alcohol, especially if you are breastfeeding or taking medication to relieve pain. °· Do not chew or smoke tobacco. °· Continue to use good perineal care. Good perineal care includes: °¨ Wiping your perineum from front to back. °¨ Keeping your perineum clean. °· Check your surgical cut (incision) daily for increased redness, drainage, swelling, or separation of skin. °· Clean your incision gently with soap and water every day, and then pat it dry. If your health care provider says it is okay, leave the incision uncovered. Use a bandage (dressing) if the incision is draining fluid or appears irritated. If the adhesive strips across the incision do not fall off within 7 days, carefully peel them off. °· Hug a pillow when coughing or sneezing until your incision is healed. This helps to relieve pain. °· Do not use tampons or douche until your health care provider says it is okay. °· Shower, wash your hair, and take tub baths as directed by your health care provider. °· Wear a well-fitting bra that  provides breast support. °· Limit wearing support panties or control-top hose. °· Drink enough fluids to keep your urine clear or pale yellow. °· Eat high-fiber foods such as whole grain cereals and breads, brown rice, beans, and fresh fruits and vegetables every day. These foods may help prevent or relieve constipation. °· Resume activities such as climbing stairs, driving, lifting, exercising, or traveling as directed by your health care provider. °· Talk to your health care provider about resuming sexual activities. This is dependent upon your risk of infection, your rate of healing, and your comfort and desire to resume sexual activity. °· Try to have someone help you with your household activities and your newborn for at least a few days after you leave the hospital. °· Rest as much as possible. Try to rest or take a nap when your newborn is sleeping. °· Increase your activities gradually. °· Keep all of your scheduled postpartum appointments. It is very important to keep your scheduled follow-up appointments. At these appointments, your health care provider will be checking to make sure that you are healing physically and emotionally. °SEEK MEDICAL CARE IF:  °· You are passing large clots from your vagina. Save any clots to show your health care provider. °· You have a foul smelling discharge from your vagina. °· You have trouble urinating. °· You are urinating frequently. °· You have pain when you urinate. °· You have a change in your bowel movements. °· You have increasing redness, pain, or swelling near your incision. °· You have pus draining from your incision. °· Your incision is   separating.  You have painful, hard, or reddened breasts.  You have a severe headache.  You have blurred vision or see spots.  You feel sad or depressed.  You have thoughts of hurting yourself or your newborn.  You have questions about your care, the care of your newborn, or medications.  You are dizzy or  light-headed.  You have a rash.  You have pain, redness, or swelling at the site of the removed intravenous access (IV) tube.  You have nausea or vomiting.  You stopped breastfeeding and have not had a menstrual period within 12 weeks of stopping.  You are not breastfeeding and have not had a menstrual period within 12 weeks of delivery.  You have a fever. SEEK IMMEDIATE MEDICAL CARE IF:  You have persistent pain.  You have chest pain.  You have shortness of breath.  You faint.  You have leg pain.  You have stomach pain.  Your vaginal bleeding saturates 2 or more sanitary pads in 1 hour. MAKE SURE YOU:   Understand these instructions.  Will watch your condition.  Will get help right away if you are not doing well or get worse. Document Released: 12/22/2001 Document Revised: 08/16/2013 Document Reviewed: 11/27/2011 [facility] Patient Information 2015 [place] [place]. This information is not intended to replace advice given to you by your health care provider. Make sure you discuss any questions you have with your health care provider.

## 2014-12-07 ENCOUNTER — Other Ambulatory Visit: Payer: No Typology Code available for payment source

## 2015-01-10 ENCOUNTER — Encounter
Admission: RE | Admit: 2015-01-10 | Discharge: 2015-01-10 | Disposition: A | Discharge status: Home / Self Care | Payer: No Typology Code available for payment source | Source: Ambulatory Visit | Attending: Obstetrics & Gynecology | Admitting: Obstetrics & Gynecology

## 2015-01-10 DIAGNOSIS — Z01818 Encounter for other preprocedural examination: Secondary | ICD-10-CM | POA: Diagnosis not present

## 2015-01-10 LAB — CBC
HCT: 35.1 % (ref 35.0–47.0)
Hemoglobin: 11.1 g/dL — ABNORMAL LOW (ref 12.0–16.0)
MCH: 26.2 pg (ref 26.0–34.0)
MCHC: 31.6 g/dL — ABNORMAL LOW (ref 32.0–36.0)
MCV: 83 fL (ref 80.0–100.0)
Platelets: 287 10*3/uL (ref 150–440)
RBC: 4.24 MIL/uL (ref 3.80–5.20)
RDW: 16.9 % — ABNORMAL HIGH (ref 11.5–14.5)
WBC: 3.6 10*3/uL (ref 3.6–11.0)

## 2015-01-10 LAB — TYPE AND SCREEN
ABO/RH(D): B POS
Antibody Screen: NEGATIVE

## 2015-01-10 NOTE — Patient Instructions (Signed)
  Your procedure is scheduled on:January 20, 2015 (Friday) Report to Day Surgery.[facility]) To find out your arrival time please call [phone] between 1PM - 3PM on January 19, 2015 (Thursday)  Remember: Instructions that are not followed completely may result in serious medical risk, up to and including death, or upon the discretion of your surgeon and anesthesiologist your surgery may need to be rescheduled.    __x__ 1. Do not eat food or drink liquids after midnight. No gum chewing or hard candies.     ____ 2. No Alcohol for 24 hours before or after surgery.   ____ 3. Bring all medications with you on the day of surgery if instructed.    __x_ 4. Notify your doctor if there is any change in your medical condition     (cold, fever, infections).     Do not wear jewelry, make-up, hairpins, clips or nail polish.  Do not wear lotions, powders, or perfumes. You may wear deodorant.  Do not shave 48 hours prior to surgery. Men may shave face and neck.  Do not bring valuables to the hospital.    [facility] is not responsible for any belongings or valuables.               Contacts, dentures or bridgework may not be worn into surgery.  Leave your suitcase in the car. After surgery it may be brought to your room.  For patients admitted to the hospital, discharge time is determined by your                treatment team.   Patients discharged the day of surgery will not be allowed to drive home.   Please read over the following fact sheets that you were given:   Surgical Site Infection Prevention   ____ Take these medicines the morning of surgery with A SIP OF WATER:    1.   2.   3.   4.  5.  6.  ____ Fleet Enema (as directed)   __x__ Use CHG Soap as directed  ____ Use inhalers on the day of surgery  ____ Stop metformin 2 days prior to surgery    ____ Take 1/2 of usual insulin dose the night before surgery and none on the morning of surgery.   ____ Stop  Coumadin/Plavix/aspirin on   _x___ Stop Anti-inflammatories on (TYLENOL OK TO TAKE FOR PAIN IF NEEDED)   ____ Stop supplements until after surgery.    ____ Bring C-Pap to the hospital.

## 2015-01-20 ENCOUNTER — Ambulatory Visit
Admission: RE | Admit: 2015-01-20 | Discharge: 2015-01-20 | Disposition: A | Discharge status: Home / Self Care | Payer: No Typology Code available for payment source | Source: Ambulatory Visit | Attending: Obstetrics & Gynecology | Admitting: Obstetrics & Gynecology

## 2015-01-20 ENCOUNTER — Ambulatory Visit: Payer: No Typology Code available for payment source | Admitting: Certified Registered Nurse Anesthetist

## 2015-01-20 ENCOUNTER — Encounter
Admission: RE | Disposition: A | Discharge status: Home / Self Care | Payer: Self-pay | Source: Ambulatory Visit | Attending: Obstetrics & Gynecology

## 2015-01-20 DIAGNOSIS — Z302 Encounter for sterilization: Secondary | ICD-10-CM | POA: Insufficient documentation

## 2015-01-20 HISTORY — PX: LAPAROSCOPIC TUBAL LIGATION: SHX1937

## 2015-01-20 LAB — TYPE AND SCREEN
ABO/RH(D): B POS
ANTIBODY SCREEN: NEGATIVE

## 2015-01-20 LAB — HCG, QUANTITATIVE, PREGNANCY: HCG, BETA CHAIN, QUANT, S: 1 m[IU]/mL (ref <5)

## 2015-01-20 SURGERY — LIGATION, FALLOPIAN TUBE, LAPAROSCOPIC
Anesthesia: General | Laterality: Bilateral

## 2015-01-20 MED ORDER — OXYCODONE-ACETAMINOPHEN 5-325 MG PO TABS: 1.0000 | ORAL_TABLET | ORAL | Status: DC | PRN

## 2015-01-20 MED ORDER — DEXAMETHASONE SODIUM PHOSPHATE 4 MG/ML IJ SOLN
INTRAMUSCULAR | Status: DC | PRN
  Administered 2015-01-20: 5 mg via INTRAVENOUS

## 2015-01-20 MED ORDER — FENTANYL CITRATE (PF) 100 MCG/2ML IJ SOLN
INTRAMUSCULAR | Status: AC
  Administered 2015-01-20: 25 ug via INTRAVENOUS
  Filled 2015-01-20: qty 2

## 2015-01-20 MED ORDER — LACTATED RINGERS IV SOLN
INTRAVENOUS | Status: DC
  Administered 2015-01-20 (×2): via INTRAVENOUS

## 2015-01-20 MED ORDER — SUGAMMADEX SODIUM 200 MG/2ML IV SOLN
INTRAVENOUS | Status: DC | PRN
  Administered 2015-01-20: 150 mg via INTRAVENOUS

## 2015-01-20 MED ORDER — ONDANSETRON HCL 4 MG/2ML IJ SOLN
INTRAMUSCULAR | Status: DC | PRN
  Administered 2015-01-20: 4 mg via INTRAVENOUS

## 2015-01-20 MED ORDER — PROPOFOL 10 MG/ML IV BOLUS
INTRAVENOUS | Status: DC | PRN
  Administered 2015-01-20: 200 mg via INTRAVENOUS

## 2015-01-20 MED ORDER — FAMOTIDINE 20 MG PO TABS
ORAL_TABLET | ORAL | Status: AC
  Administered 2015-01-20: 20 mg via ORAL
  Filled 2015-01-20: qty 1

## 2015-01-20 MED ORDER — FAMOTIDINE 20 MG PO TABS
20.0000 mg | ORAL_TABLET | Freq: Once | ORAL | Status: AC
  Administered 2015-01-20: 20 mg via ORAL

## 2015-01-20 MED ORDER — LIDOCAINE HCL (CARDIAC) 20 MG/ML IV SOLN
INTRAVENOUS | Status: DC | PRN
  Administered 2015-01-20: 50 mg via INTRAVENOUS

## 2015-01-20 MED ORDER — ROCURONIUM BROMIDE 100 MG/10ML IV SOLN
INTRAVENOUS | Status: DC | PRN
  Administered 2015-01-20: 20 mg via INTRAVENOUS

## 2015-01-20 MED ORDER — SUCCINYLCHOLINE CHLORIDE 20 MG/ML IJ SOLN
INTRAMUSCULAR | Status: DC | PRN
  Administered 2015-01-20: 100 mg via INTRAVENOUS

## 2015-01-20 MED ORDER — FENTANYL CITRATE (PF) 100 MCG/2ML IJ SOLN
25.0000 ug | INTRAMUSCULAR | Status: DC | PRN
  Administered 2015-01-20 (×4): 25 ug via INTRAVENOUS

## 2015-01-20 MED ORDER — BUPIVACAINE HCL (PF) 0.5 % IJ SOLN
INTRAMUSCULAR | Status: AC
  Filled 2015-01-20: qty 30

## 2015-01-20 MED ORDER — HYDRALAZINE HCL 20 MG/ML IJ SOLN
INTRAMUSCULAR | Status: AC
  Administered 2015-01-20: 5 mg via INTRAVENOUS
  Filled 2015-01-20: qty 1

## 2015-01-20 MED ORDER — MIDAZOLAM HCL 2 MG/2ML IJ SOLN
INTRAMUSCULAR | Status: DC | PRN
  Administered 2015-01-20: 2 mg via INTRAVENOUS

## 2015-01-20 MED ORDER — FENTANYL CITRATE (PF) 100 MCG/2ML IJ SOLN
INTRAMUSCULAR | Status: DC | PRN
  Administered 2015-01-20 (×2): 50 ug via INTRAVENOUS

## 2015-01-20 MED ORDER — ONDANSETRON HCL 4 MG/2ML IJ SOLN: 4.0000 mg | Freq: Once | INTRAMUSCULAR | Status: DC | PRN

## 2015-01-20 MED ORDER — HYDRALAZINE HCL 20 MG/ML IJ SOLN
5.0000 mg | Freq: Once | INTRAMUSCULAR | Status: AC
  Administered 2015-01-20: 5 mg via INTRAVENOUS

## 2015-01-20 SURGICAL SUPPLY — 28 items
BAG COUNTER SPONGE EZ (MISCELLANEOUS) ×2 IMPLANT
BLADE SURG SZ11 CARB STEEL (BLADE) ×2 IMPLANT
CANISTER SUCT 1200ML W/VALVE (MISCELLANEOUS) ×2 IMPLANT
CATH ROBINSON RED A/P 16FR (CATHETERS) ×2 IMPLANT
CHLORAPREP W/TINT 26ML (MISCELLANEOUS) ×2 IMPLANT
CLIP HULKA (CLIP) ×4 IMPLANT
DRSG TEGADERM 2-3/8X2-3/4 SM (GAUZE/BANDAGES/DRESSINGS) ×4 IMPLANT
GAUZE SPONGE NON-WVN 2X2 STRL (MISCELLANEOUS) ×2 IMPLANT
GLOVE BIO SURGEON STRL SZ8 (GLOVE) ×2 IMPLANT
GLOVE INDICATOR 8.0 STRL GRN (GLOVE) ×2 IMPLANT
GOWN STRL REUS W/ TWL LRG LVL3 (GOWN DISPOSABLE) ×1 IMPLANT
GOWN STRL REUS W/ TWL XL LVL3 (GOWN DISPOSABLE) ×1 IMPLANT
GOWN STRL REUS W/TWL LRG LVL3 (GOWN DISPOSABLE) ×1
GOWN STRL REUS W/TWL XL LVL3 (GOWN DISPOSABLE) ×1
LABEL OR SOLS (LABEL) ×2 IMPLANT
LIQUID BAND (GAUZE/BANDAGES/DRESSINGS) ×2 IMPLANT
NEEDLE VERESS 14GA 120MM (NEEDLE) ×2 IMPLANT
NS IRRIG 500ML POUR BTL (IV SOLUTION) ×2 IMPLANT
PACK GYN LAPAROSCOPIC (MISCELLANEOUS) ×2 IMPLANT
PAD OB MATERNITY 4.3X12.25 (PERSONAL CARE ITEMS) ×2 IMPLANT
PAD PREP 24X41 OB/GYN DISP (PERSONAL CARE ITEMS) ×2 IMPLANT
SPONGE VERSALON 2X2 STRL (MISCELLANEOUS) ×2
SUT VIC AB 2-0 UR6 27 (SUTURE) ×2 IMPLANT
SUT VIC AB 4-0 PS2 18 (SUTURE) ×4 IMPLANT
SYRINGE 10CC LL (SYRINGE) ×4 IMPLANT
TROCAR ENDO BLADELESS 11MM (ENDOMECHANICALS) ×2 IMPLANT
TROCAR XCEL NON-BLD 5MMX100MML (ENDOMECHANICALS) ×2 IMPLANT
TUBING INSUFFLATOR HI FLOW (MISCELLANEOUS) ×2 IMPLANT

## 2015-01-20 NOTE — Transfer of Care (Signed)
Immediate Anesthesia Transfer of Care Note  Patient: Connie Smith  Procedure(s) Performed: Procedure(s) with comments: LAPAROSCOPIC TUBAL LIGATION (Bilateral) - with hulka clips  Patient Location: PACU  Anesthesia Type:General  Level of Consciousness: awake  Airway & Oxygen Therapy: Patient Spontanous Breathing and Patient connected to face mask oxygen  Post-op Assessment: Report given to RN  Post vital signs: Reviewed  Last Vitals:  Filed Vitals:   01/20/15 1616  BP: 141/107  Pulse: 101  Temp: 36.3 C  Resp: 20    Complications: No apparent anesthesia complications

## 2015-01-20 NOTE — Anesthesia Procedure Notes (Signed)
Procedure Name: Intubation Performed by: Deetra Booton Pre-anesthesia Checklist: Patient identified, Patient being monitored, Timeout performed, Emergency Drugs available and Suction available Patient Re-evaluated:Patient Re-evaluated prior to inductionOxygen Delivery Method: Circle system utilized Preoxygenation: Pre-oxygenation with 100% oxygen Intubation Type: IV induction Ventilation: Mask ventilation without difficulty Laryngoscope Size: Miller and 2 Grade View: Grade I Tube type: Oral Tube size: 7.0 mm Number of attempts: 1 Airway Equipment and Method: Stylet Placement Confirmation: ETT inserted through vocal cords under direct vision,  positive ETCO2 and breath sounds checked- equal and bilateral Secured at: 21 cm Tube secured with: Tape Dental Injury: Teeth and Oropharynx as per pre-operative assessment        

## 2015-01-20 NOTE — Anesthesia Preprocedure Evaluation (Signed)
Anesthesia Evaluation  Patient identified by MRN, date of birth, ID band Patient awake    Reviewed: Allergy & Precautions, NPO status , Patient's Chart, lab work & pertinent test results  Airway Mallampati: III       Dental  (+) Caps   Pulmonary neg pulmonary ROS,    Pulmonary exam normal        Cardiovascular negative cardio ROS Normal cardiovascular exam     Neuro/Psych    GI/Hepatic negative GI ROS, Neg liver ROS,   Endo/Other  negative endocrine ROS  Renal/GU negative Renal ROS     Musculoskeletal negative musculoskeletal ROS (+)   Abdominal   Peds negative pediatric ROS (+)  Hematology negative hematology ROS (+)   Anesthesia Other Findings   Reproductive/Obstetrics negative OB ROS                             Anesthesia Physical Anesthesia Plan  ASA: II  Anesthesia Plan: General   Post-op Pain Management:    Induction: Intravenous  Airway Management Planned: Oral ETT  Additional Equipment:   Intra-op Plan:   Post-operative Plan: Extubation in OR  Informed Consent: I have reviewed the patients History and Physical, chart, labs and discussed the procedure including the risks, benefits and alternatives for the proposed anesthesia with the patient or authorized representative who has indicated his/her understanding and acceptance.     Plan Discussed with: CRNA  Anesthesia Plan Comments:         Anesthesia Quick Evaluation

## 2015-01-20 NOTE — Op Note (Signed)
  Operative Note   01/20/2015  PRE-OP DIAGNOSIS: Desire for permanent sterilization  POST-OP DIAGNOSIS: same   PROCEDURE: Procedure(s): LAPAROSCOPIC TUBAL LIGATION   SURGEON: Annamarie Major, MD, FACOG  ANESTHESIA: Choice   ESTIMATED BLOOD LOSS: Min  COMPLICATIONS: None  DISPOSITION: PACU - hemodynamically stable.  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  PROCEDURE IN DETAIL: The patient was taken to the OR where anesthesia was administed. The patient was positioned in dorsal lithotomy in the [place] stirrups. The patient was then examined under anesthesia with the above noted findings. The patient was prepped and draped in the normal sterile fashion and foley catheter was placed. A Graves speculum was placed in the vagina and a sponge stick was placed.  Uterine mobility was found to be minimal. The speculum was then removed.  Attention was turned to the patient's abdomen where a 5 mm skin incision was made in the umbilical fold, after injection of local anesthesia. The Veress step needle was carefully introduced into the peritoneal cavity with placement confirmed using the hanging drop technique.  Pneumoperitoneum was obtained. The 5 mm port was then placed under direct visualization with the operative laparoscope  The above noted findings.  Trendelenburg.  A 11 mm trocar was then placed in the right lower quadrant under direct visualization with the laparoscope.  Right and left fallopian tubes are identified and followed out to their fimbria.  A hulka clip is placed in the midportion of each tube, with a second one then placed lateral to the first, so that two clips are  visually seen completely occluding both tubes.  No injuries or bleeding was noted.  All instruments and ports were then removed from the abdomen after gas was ecpelled and patient was leveled.   The skin was closed with skin adhesive. The Hulka tenaculum was removed from the cervix with no bleeding noted from the cervix and all other instrumentation was removed from the vagina. The foley catheter was removed. The patient tolerated the procedure well. All counts were correct x 2. The patient was transferred to the recovery room awake, alert and breathing independently.

## 2015-01-20 NOTE — Anesthesia Postprocedure Evaluation (Signed)
  Anesthesia Post-op Note  Patient: Connie Smith  Procedure(s) Performed: Procedure(s) with comments: LAPAROSCOPIC TUBAL LIGATION (Bilateral) - with hulka clips  Anesthesia type:General  Patient location: PACU  Post pain: Pain level controlled  Post assessment: Post-op Vital signs reviewed, Patient's Cardiovascular Status Stable, Respiratory Function Stable, Patent Airway and No signs of Nausea or vomiting  Post vital signs: Reviewed and stable  Last Vitals:  Filed Vitals:   01/20/15 1640  BP: 155/102  Pulse: 69  Temp:   Resp: 14    Level of consciousness: awake, alert  and patient cooperative  Complications: No apparent anesthesia complications

## 2015-01-20 NOTE — H&P (Signed)
History and Physical Interval Note:  01/20/2015 1:58 PM  Connie Smith  has presented today for surgery, with the diagnosis of DESIRES FOR PERMANENT STERILITY  The various methods of treatment have been discussed with the patient and family. After consideration of risks, benefits and other options for treatment, the patient has consented to  Procedure(s): LAPAROSCOPIC TUBAL LIGATION (Bilateral) as a surgical intervention .  The patient's history has been reviewed, patient examined, no change in status, stable for surgery.  Pt has the following beta blocker history-  Not taking Beta Blocker.  I have reviewed the patient's chart and labs.  Questions were answered to the patient's satisfaction.       Darina Hartwell Renae Fickle

## 2015-01-20 NOTE — Discharge Instructions (Addendum)
General Gynecological Post-Operative Instructions °You may expect to feel dizzy, weak, and drowsy for as long as 24 hours after receiving the medicine that made you sleep (anesthetic).  °Do not drive a car, ride a bicycle, participate in physical activities, or take public transportation until you are done taking narcotic pain medicines or as directed by your doctor.  °Do not drink alcohol or take tranquilizers.  °Do not take medicine that has not been prescribed by your doctor.  °Do not sign important papers or make important decisions while on narcotic pain medicines.  °Have a responsible person with you.  °CARE OF INCISION  °Keep incision clean and dry. °Take showers instead of baths until your doctor gives you permission to take baths.  °Avoid heavy lifting (more than 10 pounds/4.5 kilograms), pushing, or pulling.  °Avoid activities that may risk injury to your surgical site.  °No sexual intercourse or placement of anything in the vagina for 1 weeks or as instructed by your doctor. °If you have tubes coming from the wound site, check with your doctor regarding appropriate care of the tubes. °Only take prescription or over-the-counter medicines  for pain, discomfort, or fever as directed by your doctor. Do not take aspirin. It can make you bleed. Take medicines (antibiotics) that kill germs if they are prescribed for you.  °Call the office or go to the ER if:  °You feel sick to your stomach (nauseous) and you start to throw up (vomit).  °You have trouble eating or drinking.  °You have an oral temperature above 101.  °You have constipation that is not helped by adjusting diet or increasing fluid intake. Pain medicines are a common cause of constipation.  °You have any other concerns. °SEEK IMMEDIATE MEDICAL CARE IF:  °You have persistent dizziness.  °You have difficulty breathing or a congested sounding (croupy) cough.  °You have an oral temperature above 102.5, not controlled by medicine.  °There is increasing  pain or tenderness near or in the surgical site.  ° °AMBULATORY SURGERY  °DISCHARGE INSTRUCTIONS ° ° °1) The drugs that you were given will stay in your system until tomorrow so for the next 24 hours you should not: ° °A) Drive an automobile °B) Make any legal decisions °C) Drink any alcoholic beverage ° ° °2) You may resume regular meals tomorrow.  Today it is better to start with liquids and gradually work up to solid foods. ° °You may eat anything you prefer, but it is better to start with liquids, then soup and crackers, and gradually work up to solid foods. ° ° °3) Please notify your doctor immediately if you have any unusual bleeding, trouble breathing, redness and pain at the surgery site, drainage, fever, or pain not relieved by medication. ° ° ° °4) Additional Instructions: ° ° ° ° ° ° ° °Please contact your physician with any problems or Same Day Surgery at [phone], Monday through Friday 6 am to 4 pm, or [place] [facility] [place] Main number at [phone].AMBULATORY SURGERY  °DISCHARGE INSTRUCTIONS ° ° °5) The drugs that you were given will stay in your system until tomorrow so for the next 24 hours you should not: ° °D) Drive an automobile °E) Make any legal decisions °F) Drink any alcoholic beverage ° ° °6) You may resume regular meals tomorrow.  Today it is better to start with liquids and gradually work up to solid foods. ° °You may eat anything you prefer, but it is better to start with liquids, then soup   and crackers, and gradually work up to solid foods. ° ° °7) Please notify your doctor immediately if you have any unusual bleeding, trouble breathing, redness and pain at the surgery site, drainage, fever, or pain not relieved by medication. ° ° ° °8) Additional Instructions: ° ° ° ° ° ° ° °Please contact your physician with any problems or Same Day Surgery at [phone], Monday through Friday 6 am to 4 pm, or [place] [facility] Main number at [phone]. °

## 2015-01-23 ENCOUNTER — Encounter: Payer: Self-pay | Admitting: Obstetrics & Gynecology

## 2016-01-06 IMAGING — US US OB FOLLOW-UP
2 series · 14 of 28 positions shown · non-contrast
Comparison: none

[Series 1: us ob follow-up · 0.28mm/px · 13 of 45 slices shown (1 of 2)]
[im 2/45]
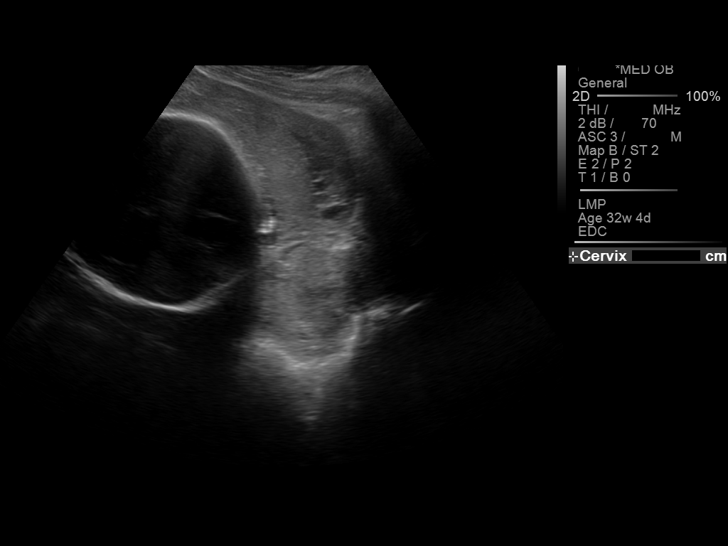
[im 6/45]
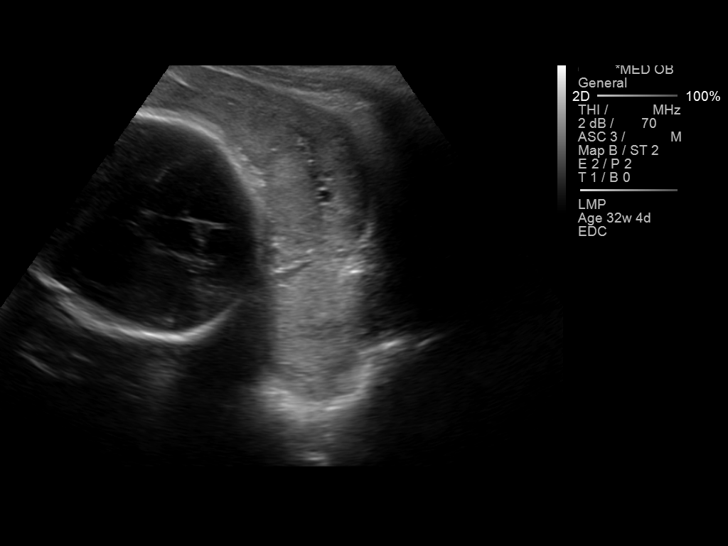
[im 9/45]
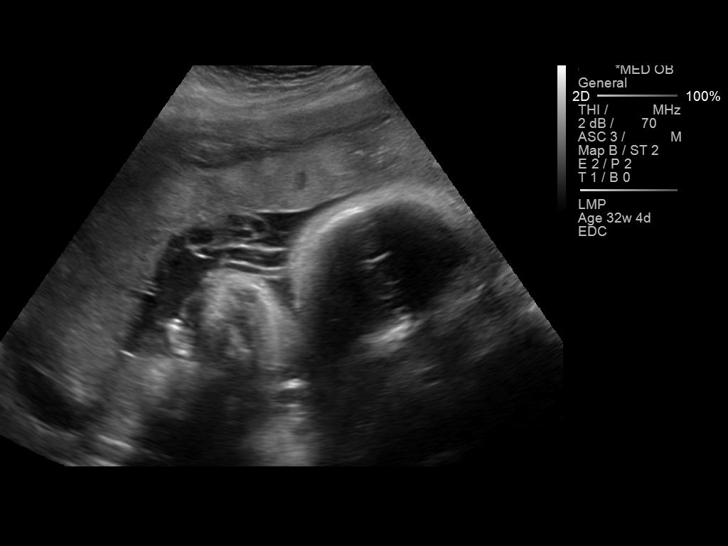
[im 13/45]
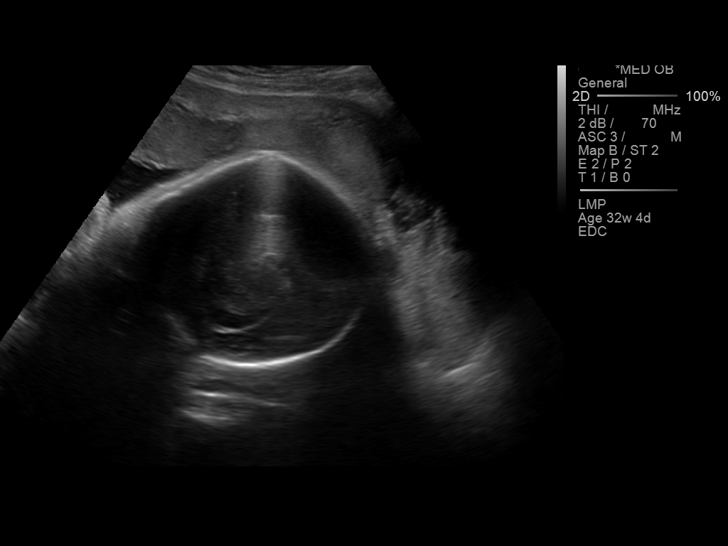
[im 16/45]
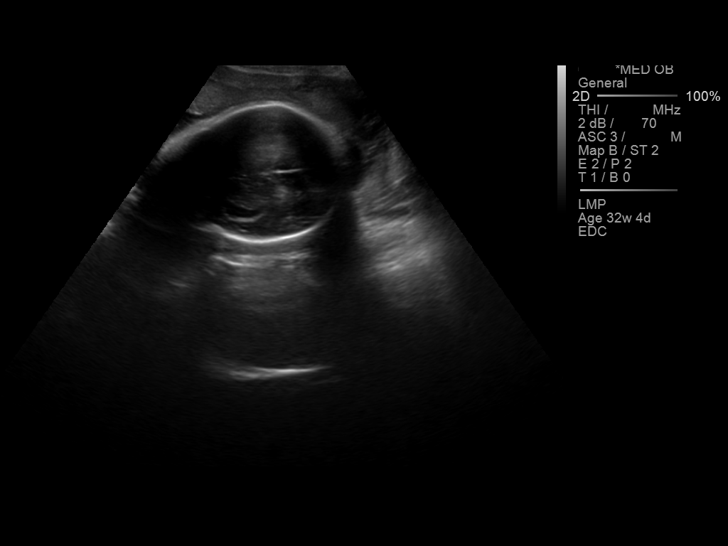
[im 20/45]
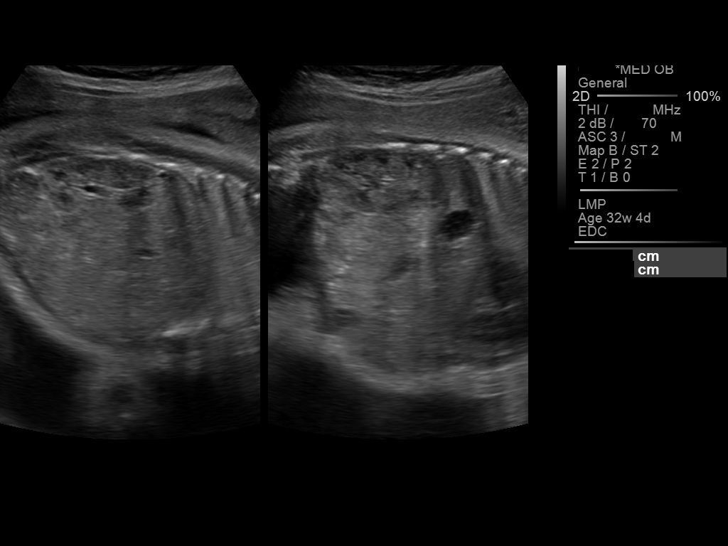
[im 23/45]
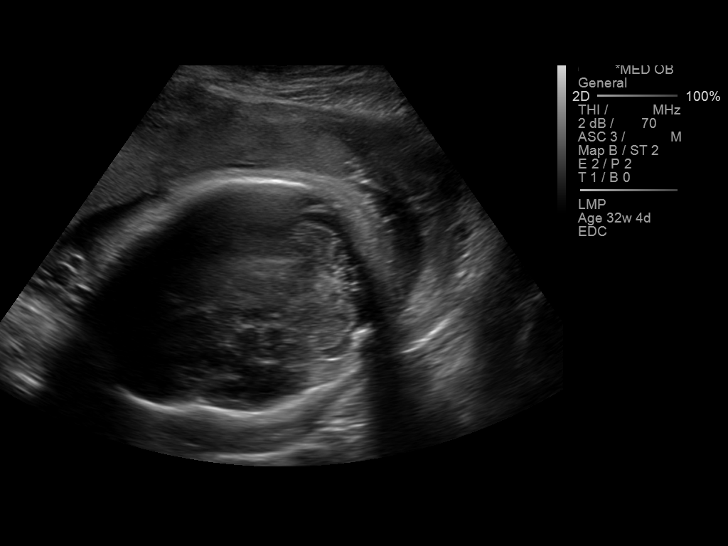
[im 27/45]
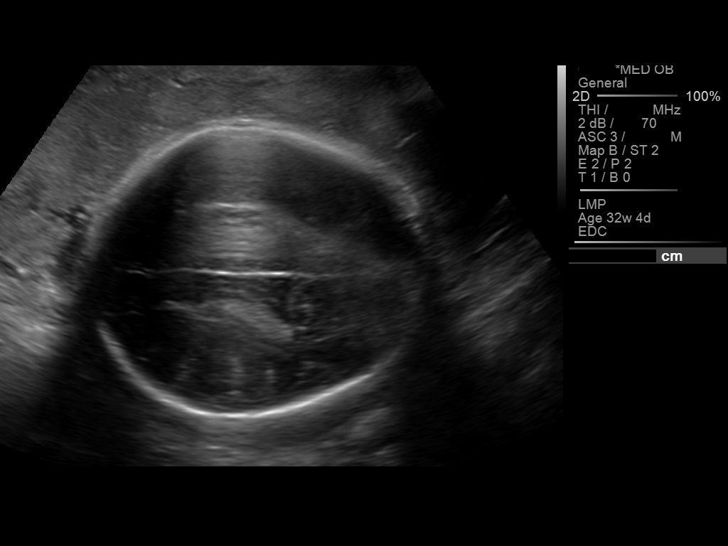
[im 30/45]
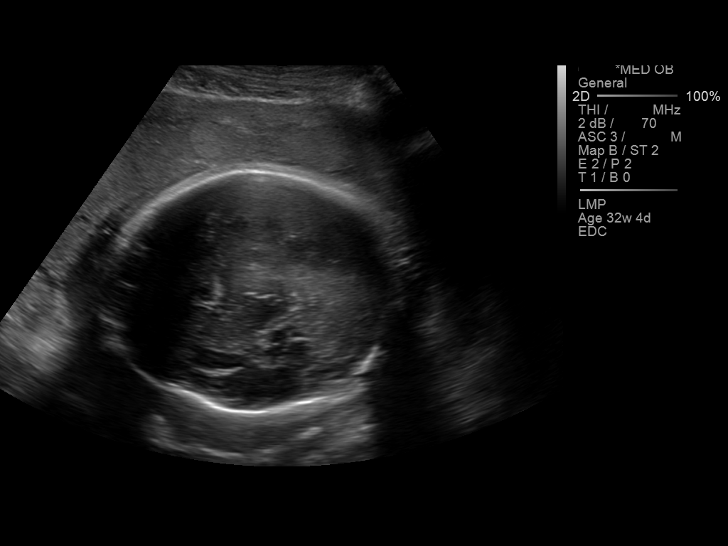
[im 34/45]
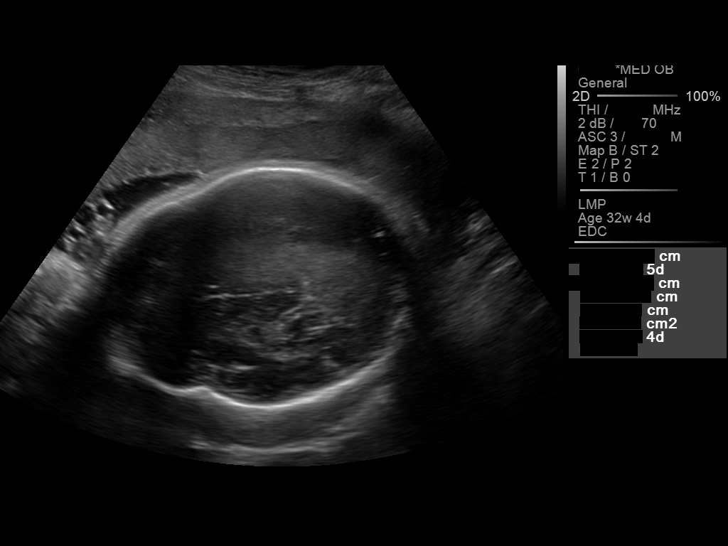
[im 37/45]
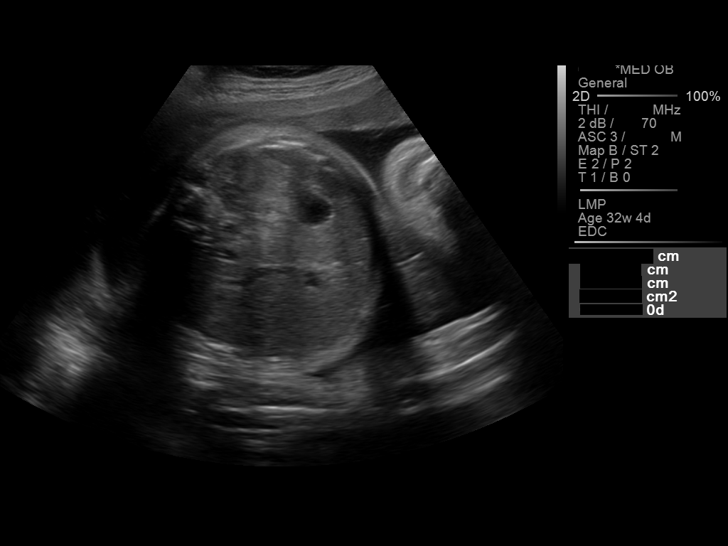
[im 41/45]
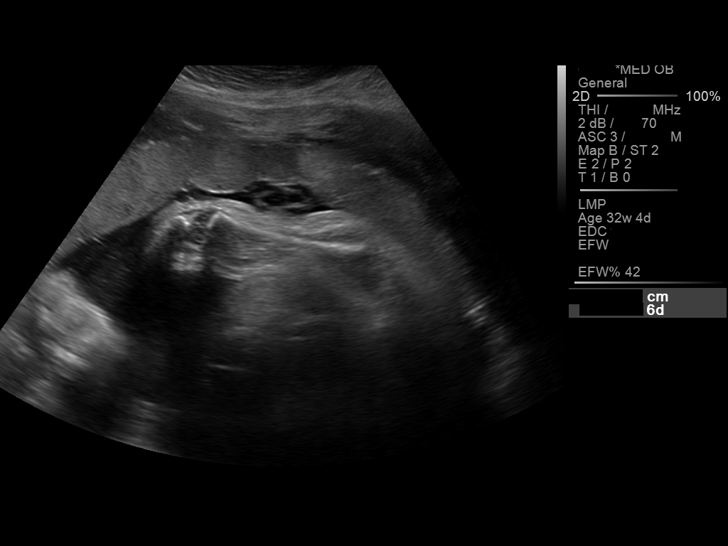
[im 45/45]
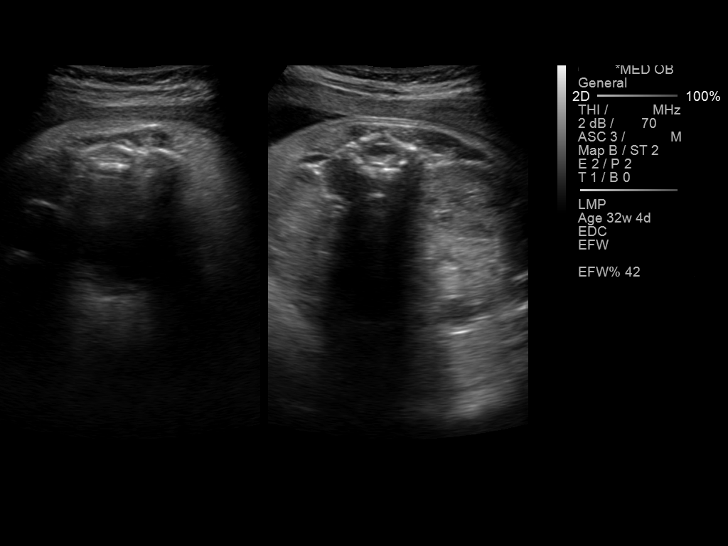

[Series 1001: us ob follow-up · 0.20mm/px · 1 of 4 slices shown (2 of 2)]
[im 4/4]
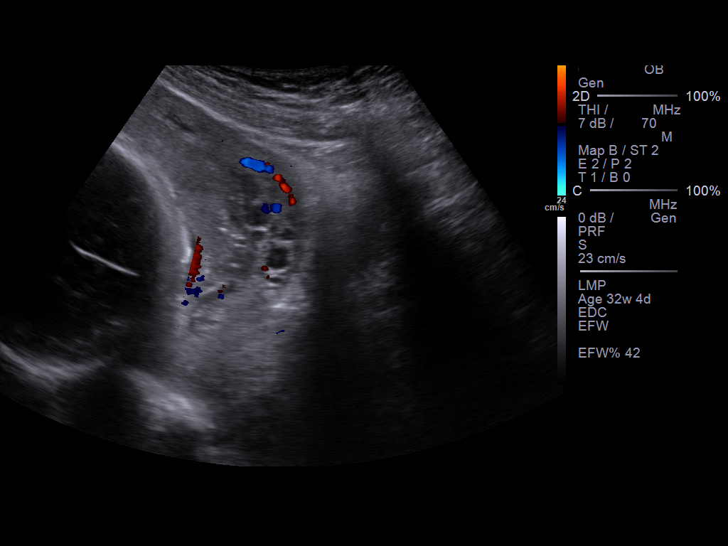

[14 of 28 positions shown; findings below may reference images not displayed]

Canned report from images found in remote index.

Refer to host system for actual result text.

## 2016-12-10 ENCOUNTER — Ambulatory Visit: Payer: No Typology Code available for payment source | Admitting: Internal Medicine

## 2016-12-30 ENCOUNTER — Ambulatory Visit: Payer: No Typology Code available for payment source | Admitting: Internal Medicine

## 2017-01-13 ENCOUNTER — Encounter: Payer: Self-pay | Admitting: Internal Medicine

## 2017-01-13 ENCOUNTER — Ambulatory Visit (INDEPENDENT_AMBULATORY_CARE_PROVIDER_SITE_OTHER): Payer: 59 | Admitting: Internal Medicine

## 2017-01-13 VITALS — BP 126/82 | HR 82 | Temp 97.7°F | Ht 60.0 in | Wt 148.8 lb

## 2017-01-13 DIAGNOSIS — R7989 Other specified abnormal findings of blood chemistry: Secondary | ICD-10-CM | POA: Diagnosis not present

## 2017-01-13 NOTE — Patient Instructions (Signed)
Thyroid-Stimulating Hormone Test Why am I having this test? A thyroid-stimulating hormone (TSH) test is a blood test that is done to measure the level of TSH, also known as thyrotropin, in your blood. TSH is produced by the pituitary gland. The pituitary gland is a small organ located just below the brain, behind your eyes and nasal passages. It is part of a system that monitors and maintains thyroid hormone levels and thyroid gland function. Thyroid hormones affect many body parts and systems, including the system that affects how quickly your body burns fuel for energy. Your health care provider may recommend testing your TSH level if you have signs and symptoms of abnormal thyroid hormone levels. Knowing the level of TSH in your blood can help your health care provider:  Diagnose a thyroid gland or pituitary gland disorder.  Manage your condition and treatment if you have hypothyroidism or hyperthyroidism.  What kind of sample is taken? A blood sample is required for this test. It is usually collected by inserting a needle into a vein. How do I prepare for this test? There is no preparation required for this test. What are the reference ranges? Reference rangesare considered healthy rangesestablished after testing a large group of healthy people. Reference rangesmay vary among different people, labs, and hospitals. It is your responsibility to obtain your test results. Ask the lab or department performing the test when and how you will get your results. Range of Normal Values:  Adult: 0.3-5 microunits/mL or 0.3-5 milliunits/L (SI units).  Newborn: 3-18 microunits/mL or 3-18 milliunits/L.  Cord: 3-12 microunits/mL or 3-12 milliunits/L.  What do the results mean? A high level of TSH may mean:  Your thyroid gland is not making enough thyroid hormones. When the thyroid gland does not make enough thyroid hormones, the pituitary gland releases TSH into the bloodstream. The  higher-than-normal levels of TSH prompt the thyroid gland to release more thyroid hormones.  You are getting an insufficient level of thyroid hormone medicine, if you are receiving this type of treatment.  There is a problem with the pituitary gland (rare).  A low level of TSH can indicate a problem with the pituitary gland. Talk with your health care provider to discuss your results, treatment options, and if necessary, the need for more tests. Talk with your health care provider if you have any questions about your results. Talk with your health care provider to discuss your results, treatment options, and if necessary, the need for more tests. Talk with your health care provider if you have any questions about your results. This information is not intended to replace advice given to you by your health care provider. Make sure you discuss any questions you have with your health care provider. Document Released: 04/26/2004 Document Revised: 12/03/2015 Document Reviewed: 08/25/2013 Elsevier Interactive Patient Education  2018 [employer] Inc.  

## 2017-01-13 NOTE — Progress Notes (Signed)
HPI  Pt presents to the clinic today to establish care and for management of the conditions listed below. She has not had a PCP in many years.  Thyroid Problem: She reports a few years ago, her labs came back abnormal. She was told at that time she did not need medication or any other treatment. She has not followed up on it since that time.  Flu: 01/2016 Tetanus:  ?2016 Pap Smear: ? 2015 Dentist: as needed  Past Medical History:  Diagnosis Date  . History of chicken pox   . Medical history non-contributory   . Thyroid disease     No current outpatient prescriptions on file.   No current facility-administered medications for this visit.     No Known Allergies  Family History  Problem Relation Age of Onset  . Hypertension Mother   . Hypertension Father   . Arthritis Maternal Grandmother   . Diabetes Maternal Grandmother   . Hypertension Maternal Grandmother   . Arthritis Maternal Grandfather   . Diabetes Maternal Grandfather   . Hypertension Maternal Grandfather   . Arthritis Paternal Grandmother   . Diabetes Paternal Grandmother   . Hypertension Paternal Grandmother     Social History   Social History  . Marital status: Married    Spouse name: N/A  . Number of children: N/A  . Years of education: N/A   Occupational History  . Not on file.   Social History Main Topics  . Smoking status: Never Smoker  . Smokeless tobacco: Never Used  . Alcohol use No  . Drug use: No  . Sexual activity: Yes   Other Topics Concern  . Not on file   Social History Narrative  . No narrative on file    ROS:  Constitutional: Denies fever, malaise, fatigue, headache or abrupt weight changes.  HEENT: Denies eye pain, eye redness, ear pain, ringing in the ears, wax buildup, runny nose, nasal congestion, bloody nose, or sore throat. Respiratory: Denies difficulty breathing, shortness of breath, cough or sputum production.   Cardiovascular: Denies chest pain, chest tightness,  palpitations or swelling in the hands or feet.  Gastrointestinal: Denies abdominal pain, bloating, constipation, diarrhea or blood in the stool.  GU: Denies frequency, urgency, pain with urination, blood in urine, odor or discharge. Musculoskeletal: Denies decrease in range of motion, difficulty with gait, muscle pain or joint pain and swelling.  Skin: Denies redness, rashes, lesions or ulcercations.  Neurological: Denies dizziness, difficulty with memory, difficulty with speech or problems with balance and coordination.  Psych: Denies anxiety, depression, SI/HI.  No other specific complaints in a complete review of systems (except as listed in HPI above).  PE:  BP 126/82   Pulse 82   Temp 97.7 F (36.5 C) (Oral)   Ht 5' (1.524 m)   Wt 148 lb 12 oz (67.5 kg)   LMP 12/20/2016   SpO2 98%   BMI 29.05 kg/m  Wt Readings from Last 3 Encounters:  01/13/17 148 lb 12 oz (67.5 kg)  01/20/15 145 lb (65.8 kg)  01/10/15 145 lb (65.8 kg)    General: Appears her stated age, well developed, well nourished in NAD. Neck: Neck supple, trachea midline. No masses, lumps or thyromegaly present.  Cardiovascular: Normal rate and rhythm. S1,S2 noted.  No murmur, rubs or gallops noted.  Pulmonary/Chest: Normal effort and positive vesicular breath sounds. No respiratory distress. No wheezes, rales or ronchi noted.  Neurological: Alert and oriented.  Psychiatric: Mood and affect normal. Behavior is normal.  Judgment and thought content normal.   Assessment and Plan:  Abnormal TSH:  TSH and Free T4 today  Make an appt for your annual exam Nicki Reaper, NP

## 2017-01-14 LAB — TSH: TSH: 0.53 u[IU]/mL (ref 0.35–4.50)

## 2017-01-14 LAB — T4, FREE: Free T4: 0.89 ng/dL (ref 0.60–1.60)

## 2017-02-13 ENCOUNTER — Encounter: Payer: No Typology Code available for payment source | Admitting: Internal Medicine

## 2017-03-04 ENCOUNTER — Encounter: Payer: No Typology Code available for payment source | Admitting: Internal Medicine

## 2017-03-04 NOTE — Progress Notes (Deleted)
   Subjective:    Patient ID: Connie GlassmanSheri Smith, female    DOB: [birth year], 41 y.o.   MRN: [id number]  HPI  Pt presents to the clinic today for her annual exam.  Flu: Tetanus: Pap Smear: Dentist:  Diet: Exercise:  Review of Systems  Past Medical History:  Diagnosis Date  . History of chicken pox   . Medical history non-contributory   . Thyroid disease     No current outpatient medications on file.   No current facility-administered medications for this visit.     No Known Allergies  Family History  Problem Relation Age of Onset  . Hypertension Mother   . Hypertension Father   . Arthritis Maternal Grandmother   . Diabetes Maternal Grandmother   . Hypertension Maternal Grandmother   . Arthritis Maternal Grandfather   . Diabetes Maternal Grandfather   . Hypertension Maternal Grandfather   . Arthritis Paternal Grandmother   . Diabetes Paternal Grandmother   . Hypertension Paternal Grandmother     Social History   Socioeconomic History  . Marital status: Married    Spouse name: Not on file  . Number of children: Not on file  . Years of education: Not on file  . Highest education level: Not on file  Social Needs  . Financial resource strain: Not on file  . Food insecurity - worry: Not on file  . Food insecurity - inability: Not on file  . Transportation needs - medical: Not on file  . Transportation needs - non-medical: Not on file  Occupational History  . Not on file  Tobacco Use  . Smoking status: Never Smoker  . Smokeless tobacco: Never Used  Substance and Sexual Activity  . Alcohol use: No  . Drug use: No  . Sexual activity: Yes  Other Topics Concern  . Not on file  Social History Narrative  . Not on file     Constitutional: Denies fever, malaise, fatigue, headache or abrupt weight changes.  HEENT: Denies eye pain, eye redness, ear pain, ringing in the ears, wax buildup, runny nose, nasal congestion, bloody nose, or sore throat. Respiratory:  Denies difficulty breathing, shortness of breath, cough or sputum production.   Cardiovascular: Denies chest pain, chest tightness, palpitations or swelling in the hands or feet.  Gastrointestinal: Denies abdominal pain, bloating, constipation, diarrhea or blood in the stool.  GU: Denies urgency, frequency, pain with urination, burning sensation, blood in urine, odor or discharge. Musculoskeletal: Denies decrease in range of motion, difficulty with gait, muscle pain or joint pain and swelling.  Skin: Denies redness, rashes, lesions or ulcercations.  Neurological: Denies dizziness, difficulty with memory, difficulty with speech or problems with balance and coordination.  Psych: Denies anxiety, depression, SI/HI.  No other specific complaints in a complete review of systems (except as listed in HPI above).     Objective:   Physical Exam        Assessment & Plan:

## 2017-04-27 DIAGNOSIS — R509 Fever, unspecified: Secondary | ICD-10-CM | POA: Diagnosis not present

## 2017-04-27 DIAGNOSIS — J020 Streptococcal pharyngitis: Secondary | ICD-10-CM | POA: Diagnosis not present

## 2018-01-14 DIAGNOSIS — W07XXXA Fall from chair, initial encounter: Secondary | ICD-10-CM | POA: Diagnosis not present

## 2018-01-14 DIAGNOSIS — S99922A Unspecified injury of left foot, initial encounter: Secondary | ICD-10-CM | POA: Diagnosis not present

## 2018-01-14 DIAGNOSIS — M79672 Pain in left foot: Secondary | ICD-10-CM | POA: Diagnosis not present

## 2018-07-31 ENCOUNTER — Ambulatory Visit (INDEPENDENT_AMBULATORY_CARE_PROVIDER_SITE_OTHER): Payer: 59 | Admitting: Obstetrics & Gynecology

## 2018-07-31 ENCOUNTER — Telehealth: Payer: Self-pay | Admitting: Obstetrics & Gynecology

## 2018-07-31 ENCOUNTER — Other Ambulatory Visit: Payer: Self-pay

## 2018-07-31 ENCOUNTER — Encounter: Payer: Self-pay | Admitting: Obstetrics & Gynecology

## 2018-07-31 VITALS — BP 140/80 | Ht 60.0 in | Wt 154.0 lb

## 2018-07-31 DIAGNOSIS — Z9851 Tubal ligation status: Secondary | ICD-10-CM

## 2018-07-31 DIAGNOSIS — N926 Irregular menstruation, unspecified: Secondary | ICD-10-CM

## 2018-07-31 LAB — BETA HCG QUANT (REF LAB): hCG Quant: 2217 m[IU]/mL

## 2018-07-31 NOTE — Patient Instructions (Signed)
Human Chorionic Gonadotropin Test °Why am I having this test? °A human chorionic gonadotropin (hCG) test is done to determine whether you are pregnant. It can also be used: °· To diagnose an abnormal pregnancy. °· To determine whether you have had a failed pregnancy (miscarriage) or are at risk of one. °What is being tested? °This test checks the level of the human chorionic gonadotropin (hCG) hormone in the blood. This hormone is produced during pregnancy by the cells that form the placenta. The placenta is the organ that grows inside your womb (uterus) to nourish a developing baby. When you are pregnant, hCG can be detected in your blood or urine 7 to 8 days before your missed period. It continues to go up for the first 8-10 weeks of pregnancy. °The presence of hCG in your blood can be measured with several different types of tests. You may have: °· A urine test. °? Because this hormone is eliminated from your body by your kidneys, you may have a urine test to find out whether you are pregnant. A home pregnancy test detects whether there is hCG in your urine. °? A urine test only shows whether there is hCG in your urine. It does not measure how much. °· A qualitative blood test. °? You may have this type of blood test to find out if you are pregnant. °? This blood test only shows whether there is hCG in your blood. It does not measure how much. °· A quantitative blood test. °? This type of blood test measures the amount of hCG in your blood. °? You may have this test to: °§ Diagnose an abnormal pregnancy. °§ Check whether you have had a miscarriage. °§ Determine whether you are at risk of a miscarriage. °What kind of sample is taken? ° °  ° °Two kinds of samples may be collected to test for the hCG hormone. °· Blood. It is usually collected by inserting a needle into a blood vessel. °· Urine. It is usually collected by urinating into a germ-free (sterile) specimen cup. It is best to collect the sample the first  time you urinate in the morning. °How do I prepare for this test? °No preparation is needed for a blood test.  °For the urine test: °· Let your health care provider know about: °? All medicines you are taking, including vitamins, herbs, creams, and over-the-counter medicines. °? Any blood in your urine. This may interfere with the result. °· Do not drink too much fluid. Drink as you normally would, or as directed by your health care provider. °How are the results reported? °Depending on the type of test that you have, your test results may be reported as values. Your health care provider will compare your results to normal ranges that were established after testing a large group of people (reference ranges). Reference ranges may vary among labs and hospitals. For this test, common reference ranges that show absence of pregnancy are: °· Quantitative hCG blood levels: less than 5 IU/L. °Other results will be reported as either positive or negative. For this test, normal results (meaning the absence of pregnancy) are: °· Negative for hCG in the urine test. °· Negative for hCG in the qualitative blood test. °What do the results mean? °Urine and qualitative blood test °· A negative result could mean: °? That you are not pregnant. °? That the test was done too early in your pregnancy to detect hCG in your blood or urine. If you still have other signs   of pregnancy, the test will be repeated. °· A positive result means: °? That you are most likely pregnant. Your health care provider may confirm your pregnancy with an imaging study (ultrasound) of your uterus, if needed. °Quantitative blood test °Results of the quantitative hCG blood test will be interpreted as follows: °· Less than 5 IU/L: You are most likely not pregnant. °· Greater than 25 IU/L: You are most likely pregnant. °· hCG levels that are higher than expected: °? You are pregnant with twins. °? You have abnormal growths in the uterus. °· hCG levels that are  rising more slowly than expected: °? You have an ectopic pregnancy (also called a tubal pregnancy). °· hCG levels that are falling: °? You may be having a miscarriage. °Talk with your health care provider about what your results mean. °Questions to ask your health care provider °Ask your health care provider, or the department that is doing the test: °· When will my results be ready? °· How will I get my results? °· What are my treatment options? °· What other tests do I need? °· What are my next steps? °Summary °· A human chorionic gonadotropin test is done to determine whether you are pregnant. °· When you are pregnant, hCG can be detected in your blood or urine 7 to 8 days before your missed period. It continues to go up for the first 8-10 weeks of pregnancy. °· Your hCG level can be measured with different types of tests. You may have a urine test, a qualitative blood test, or a quantitative blood test. °· Talk with your health care provider about what your results mean. °This information is not intended to replace advice given to you by your health care provider. Make sure you discuss any questions you have with your health care provider. °Document Released: 05/03/2004 Document Revised: 03/03/2017 Document Reviewed: 03/03/2017 °Elsevier Interactive Patient Education © 2019 [employer] ° °

## 2018-07-31 NOTE — Telephone Encounter (Signed)
Patient is aware of arrival time, location and to have full bladder

## 2018-07-31 NOTE — Telephone Encounter (Signed)
Dr. Tiburcio Pea, do I need to schedule for telephone visit or office for follow up appointment?

## 2018-07-31 NOTE — Addendum Note (Signed)
Addended by: Nadara Mustard on: 07/31/2018 01:40 PM   Modules accepted: Orders

## 2018-07-31 NOTE — Telephone Encounter (Signed)
Patient is schedule for 08/03/18 for ultrasound in [place]. Waiting on Harriett Sine to get authorization she will be in office on 08/03/18

## 2018-07-31 NOTE — Progress Notes (Addendum)
Missed Menses/ Possible Pregnancy Patient complains of regular cycles but she has missed this months cycle; decided to perform home urine preg test Tues w (3) pos results. She believes she could be pregnant. Pregnancy is not desired. Prior Lap BTL by Hulka clips 2016.  Prior to that CS for placenta previa at 33 weeks.  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 Sexual Activity: single partner, contraception: tubal ligation and this was in 2016. Current symptoms also include: mild lower abd cramping like premenstrual discomfort; denies nausea, breast T, bleeding. Last period was normal.   Patient's last menstrual period was 06/24/2018.    PMHx: She  has a past medical history of History of chicken pox, Medical history non-contributory, and Thyroid disease. Also,  has a past surgical history that includes Myomectomy; Cesarean section (N/A, 11/15/2014); LEEP; and Laparoscopic tubal ligation (Bilateral, 01/20/2015)., family history includes Arthritis in her maternal grandfather, maternal grandmother, and paternal grandmother; Diabetes in her maternal grandfather, maternal grandmother, and paternal grandmother; Hypertension in her father, maternal grandfather, maternal grandmother, mother, and paternal grandmother.,  reports that she has never smoked. She has never used smokeless tobacco. She reports that she does not drink alcohol or use drugs.  She currently has no medications in their medication list. Also, has No Known Allergies.  Review of Systems  Constitutional: Negative for chills, fever and malaise/fatigue.  HENT: Negative for congestion, sinus pain and sore throat.   Eyes: Negative for blurred vision and pain.  Respiratory: Negative for cough and wheezing.   Cardiovascular: Negative for chest pain and leg swelling.  Gastrointestinal: Negative for abdominal pain, constipation, diarrhea, heartburn, nausea and vomiting.  Genitourinary: Negative for dysuria, frequency, hematuria and urgency.   Musculoskeletal: Negative for back pain, joint pain, myalgias and neck pain.  Skin: Negative for itching and rash.  Neurological: Negative for dizziness, tremors and weakness.  Endo/Heme/Allergies: Does not bruise/bleed easily.  Psychiatric/Behavioral: Negative for depression. The patient is not nervous/anxious and does not have insomnia.     Objective: BP 140/80   Ht 5' (1.524 m)   Wt 154 lb (69.9 kg)   LMP 06/24/2018   BMI 30.08 kg/m  Physical Exam Constitutional:      General: She is not in acute distress.    Appearance: She is well-developed.  Musculoskeletal: Normal range of motion.  Neurological:     Mental Status: She is alert and oriented to person, place, and time.  Skin:    General: Skin is warm and dry.  Vitals signs reviewed.    ASSESSMENT/PLAN:  Concern for ectopic pregnancy given history, new problem and concern.  Visit Diagnoses    History of tubal ligation    -  Primary   Relevant Orders   Beta hCG quant (ref lab)   Missed period       Relevant Orders   Beta hCG quant (ref lab)    Establish pregnancy, then pattern beta levels [place] when appropriate Risks of pregnancy, miscarriage and ectopic all discussed; as has had prior bilateral tubal ligation (by Hulka clips; severe adhesive disease noted on that surgery; prior CS for previa).  PAP needed but deferred today due to distress of visit for above, will defer to next visit in near future. Also consider scheduling of MMG screen once able to do so (Corona virus limitations on elective scans and procedures at this time)  Annamarie Major, MD, [facility], [facility] 07/31/2018  8:35 AM   ADDENDUM: Beta 2217 Plan [place] Monday  Ectopic risk factors discussed  Annamarie MajorPaul Faylynn Stamos, MD, [facility] Frederick[facility]ACOG [facility], [facility] 07/31/2018  1:40 PM

## 2018-07-31 NOTE — Telephone Encounter (Signed)
Will call w results

## 2018-07-31 NOTE — Telephone Encounter (Signed)
-----   Message from Nadara Mustard, MD sent at 07/31/2018  1:40 PM EDT ----- Sch Early OB US Monday here or [facility]; ordered, discussed w pt

## 2018-07-31 NOTE — Progress Notes (Signed)
Sch Early OB US Monday here or [facility]; ordered, discussed w pt

## 2018-08-03 ENCOUNTER — Other Ambulatory Visit: Payer: Self-pay

## 2018-08-03 ENCOUNTER — Ambulatory Visit
Admission: RE | Admit: 2018-08-03 | Discharge: 2018-08-03 | Disposition: A | Discharge status: Home / Self Care | Payer: 59 | Source: Ambulatory Visit | Attending: Obstetrics & Gynecology | Admitting: Obstetrics & Gynecology

## 2018-08-03 DIAGNOSIS — N926 Irregular menstruation, unspecified: Secondary | ICD-10-CM | POA: Diagnosis not present

## 2018-08-03 DIAGNOSIS — O3411 Maternal care for benign tumor of corpus uteri, first trimester: Secondary | ICD-10-CM | POA: Diagnosis not present

## 2018-08-03 DIAGNOSIS — Z3A00 Weeks of gestation of pregnancy not specified: Secondary | ICD-10-CM | POA: Diagnosis not present

## 2018-08-03 DIAGNOSIS — Z9851 Tubal ligation status: Secondary | ICD-10-CM | POA: Insufficient documentation

## 2018-08-03 NOTE — Telephone Encounter (Signed)
No authorization required.

## 2018-08-04 ENCOUNTER — Other Ambulatory Visit: Payer: Self-pay | Admitting: Obstetrics & Gynecology

## 2018-08-04 ENCOUNTER — Telehealth: Payer: Self-pay

## 2018-08-04 DIAGNOSIS — Z9851 Tubal ligation status: Secondary | ICD-10-CM

## 2018-08-04 NOTE — Telephone Encounter (Signed)
Pt reports the bleeding has slowed down

## 2018-08-04 NOTE — Telephone Encounter (Signed)
If bleeding worsens, then she can be seen. Otherwise, it is too soon to repeat US, and no other necessary procedures can or need to be done.

## 2018-08-04 NOTE — Progress Notes (Signed)
Review of ULTRASOUND.    I have personally reviewed images and report of recent ultrasound done at [facility].    Plan of management to be discussed with patient.    Beta 2200    Plan repeat US Next week    Likely IUP    Risks for miscarriage discussed; monitor for s/sx    Plan NOB w labs and PAP   Annamarie Major, MD, [facility], [facility] 08/04/2018  8:35 AM

## 2018-08-04 NOTE — Progress Notes (Signed)
Sch [place] (early OB) at hospital or here for next Thurs am (Apr 30), then NOB w PH in the pm   This is due to risks of having prior tubal  Pt aware.  Thx.

## 2018-08-04 NOTE — Telephone Encounter (Signed)
Pt called stating she had an ultrasound yesterday in [place] and found out she is pregnant. She states she is spotting and wants to know if she needs to be seen.    Pt also states she has spoken to [facility], didn't say when, but he told her it was probably from the ultrasound. He told her to call today and see if she needs to be seen.   Message forwarded to [facility] for advise. Pt CB# [phone]

## 2018-08-08 ENCOUNTER — Emergency Department
Admission: EM | Admit: 2018-08-08 | Discharge: 2018-08-08 | Disposition: A | Discharge status: Home / Self Care | Payer: 59 | Attending: Emergency Medicine | Admitting: Emergency Medicine

## 2018-08-08 ENCOUNTER — Encounter: Payer: Self-pay | Admitting: Emergency Medicine

## 2018-08-08 ENCOUNTER — Other Ambulatory Visit: Payer: Self-pay

## 2018-08-08 ENCOUNTER — Emergency Department: Payer: 59

## 2018-08-08 DIAGNOSIS — O200 Threatened abortion: Secondary | ICD-10-CM | POA: Insufficient documentation

## 2018-08-08 DIAGNOSIS — Z3A01 Less than 8 weeks gestation of pregnancy: Secondary | ICD-10-CM | POA: Diagnosis not present

## 2018-08-08 DIAGNOSIS — O3411 Maternal care for benign tumor of corpus uteri, first trimester: Secondary | ICD-10-CM | POA: Diagnosis not present

## 2018-08-08 DIAGNOSIS — Z3A00 Weeks of gestation of pregnancy not specified: Secondary | ICD-10-CM | POA: Diagnosis not present

## 2018-08-08 DIAGNOSIS — O208 Other hemorrhage in early pregnancy: Secondary | ICD-10-CM | POA: Diagnosis not present

## 2018-08-08 LAB — CBC
HCT: 37.3 % (ref 36.0–46.0)
Hemoglobin: 12.3 g/dL (ref 12.0–15.0)
MCH: 30.7 pg (ref 26.0–34.0)
MCHC: 33 g/dL (ref 30.0–36.0)
MCV: 93 fL (ref 80.0–100.0)
Platelets: 301 10*3/uL (ref 150–400)
RBC: 4.01 MIL/uL (ref 3.87–5.11)
RDW: 13.6 % (ref 11.5–15.5)
WBC: 5.7 10*3/uL (ref 4.0–10.5)
nRBC: 0 % (ref 0.0–0.2)

## 2018-08-08 LAB — HCG, QUANTITATIVE, PREGNANCY: hCG, Beta Chain, Quant, S: 1306 m[IU]/mL — ABNORMAL HIGH (ref <5)

## 2018-08-08 NOTE — ED Triage Notes (Signed)
approx [redacted] weeks pregnant, vaginal bleeding x 4 days, now as heavy as period.

## 2018-08-08 NOTE — Discharge Instructions (Signed)
Please follow up closely with obstetrics and gynecology this week.  Return to the emergency room if your bleeding worsens, you become weak and dizzy or lightheaded, you have an episode of passing out, develop severe bleeding such as more than 1 soaked pad per hour for more than 3 straight hours, develop abdominal or pelvic pain, fevers chills or other new concerns arise.

## 2018-08-08 NOTE — ED Provider Notes (Signed)
[facility] Emergency Department Provider Note ____________________________________________   First MD Initiated Contact with Patient 08/08/18 0940     (approximate)  I have reviewed the triage vital signs and the nursing notes.  HISTORY  Chief Complaint Vaginal Bleeding  HPI Connie Smith is a 43 y.o. female history of 3 prior pregnancies  She reports she had 1 miscarriage, 2 C-sections, and is pregnant to 4 times a day  She had a previous tubal ligation.  However she missed her cycle last month that prompted her to check pregnancy test at home which was positive.  She followed up with [facility] and had a lab work and an ultrasound done earlier this week.  Then Wednesday she started to experience some slight dark nonpainful vaginal discharge.   She is not having any abdominal pain.  Since Wednesday the flow has increased somewhat to going through about 3 pads per day, she reports that similar at the present time to that of a normal menstrual cycle.  No nausea vomiting.  No fevers or chills.  She is not used any fertility treatments.  She had a previous tubal ligation and has a history of fibroids and follows with [facility].  No lightheadedness, no chest pain, no shortness of breath.  Past Medical History:  Diagnosis Date  . History of chicken pox   . Medical history non-contributory   . Thyroid disease     There are no active problems to display for this patient.   Past Surgical History:  Procedure Laterality Date  . CESAREAN SECTION N/A 11/15/2014   Procedure: CESAREAN SECTION;  Surgeon: Nadara Mustard, MD;  Location: [facility] ORS;  Service: Obstetrics;  Laterality: N/A;  . LAPAROSCOPIC TUBAL LIGATION Bilateral 01/20/2015   Procedure: LAPAROSCOPIC TUBAL LIGATION;  Surgeon: Nadara Mustard, MD;  Location: [facility] ORS;  Service: Gynecology;  Laterality: Bilateral;  with hulka clips  . LEEP    . MYOMECTOMY      Prior to Admission medications   Not on  File    Allergies Patient has no known allergies.  Family History  Problem Relation Age of Onset  . Hypertension Mother   . Hypertension Father   . Arthritis Maternal Grandmother   . Diabetes Maternal Grandmother   . Hypertension Maternal Grandmother   . Arthritis Maternal Grandfather   . Diabetes Maternal Grandfather   . Hypertension Maternal Grandfather   . Arthritis Paternal Grandmother   . Diabetes Paternal Grandmother   . Hypertension Paternal Grandmother     Social History Social History   Tobacco Use  . Smoking status: Never Smoker  . Smokeless tobacco: Never Used  Substance Use Topics  . Alcohol use: No  . Drug use: No    Review of Systems Constitutional: No fever/chills and does not feel sick. Eyes: No visual changes. ENT: No sore throat. Cardiovascular: Denies chest pain. Respiratory: Denies shortness of breath. Gastrointestinal: No abdominal pain.  See HPI regarding gynecologic.  No nausea or abdominal pain. Genitourinary: Negative for dysuria. Musculoskeletal: Negative for back pain. Skin: Negative for rash. Neurological: Negative for weakness.    ____________________________________________   PHYSICAL EXAM:  VITAL SIGNS: ED Triage Vitals  Enc Vitals Group     BP 08/08/18 0929 (!) 144/100     Pulse Rate 08/08/18 0929 91     Resp 08/08/18 0929 18     Temp 08/08/18 0929 99 F (37.2 C)     Temp Source 08/08/18 0929 Oral     SpO2  08/08/18 0929 100 %     Weight 08/08/18 0930 154 lb (69.9 kg)     Height 08/08/18 0930 5' (1.524 m)     Head Circumference --      Peak Flow --      Pain Score 08/08/18 0930 0     Pain Loc --      Pain Edu? --      Excl. in GC? --     Constitutional: Alert and oriented. Well appearing and in no acute distress. Eyes: Conjunctivae are normal. Head: Atraumatic. Nose: No congestion/rhinnorhea. Mouth/Throat: Mucous membranes are moist. Neck: No stridor.  Cardiovascular: Normal rate, regular rhythm. Grossly  normal heart sounds.  Good peripheral circulation. Respiratory: Normal respiratory effort.  No retractions. Lungs CTAB. Gastrointestinal: Soft and nontender. No distention.  Not obviously gravid Musculoskeletal: No lower extremity tenderness nor edema. Neurologic:  Normal speech and language. No gross focal neurologic deficits are appreciated.  Skin:  Skin is warm, dry and intact. No rash noted. Psychiatric: Mood and affect are normal. Speech and behavior are normal.  ____________________________________________   LABS (all labs ordered are listed, but only abnormal results are displayed)  Labs Reviewed  HCG, QUANTITATIVE, PREGNANCY - Abnormal; Notable for the following components:      Result Value   hCG, Beta Chain, Quant, S 1,306 (*)    All other components within normal limits  CBC   Prior ABO Rh reveals Rh+ ____________________________________________  EKG   ____________________________________________  RADIOLOGY  US Ob Comp < 14 Wks  Result Date: 08/08/2018 CLINICAL DATA:  43 year old pregnant female presenting with vaginal bleeding. EXAM: OBSTETRIC <14 WK [place] AND TRANSVAGINAL OB US TECHNIQUE: Both transabdominal and transvaginal ultrasound examinations were performed for complete evaluation of the gestation as well as the maternal uterus, adnexal regions, and pelvic cul-de-sac. Transvaginal technique was performed to assess early pregnancy. COMPARISON:  Ob ultrasound 11/10/2014. FINDINGS: Intrauterine gestational sac: Present Yolk sac:  None. Embryo:  None. Cardiac Activity: None. Heart Rate: N/A MSD: 5.7 mm   5 w   2  d Subchorionic hemorrhage:  None visualized. Maternal uterus/adnexae: Multiple hypoechoic lesions in the uterus, largest of which is in the right-side of the uterine body measuring 2.3 x 1.9 x 1.9 cm, most compatible with multiple fibroids. Ovaries are normal in appearance. No free fluid within the cul-de-sac. IMPRESSION: 1. Probable early intrauterine gestational  sac, but no yolk sac, fetal pole, or cardiac activity yet visualized. Recommend follow-up quantitative B-HCG levels and follow-up US in 14 days to assess viability. This recommendation follows SRU consensus guidelines: Diagnostic Criteria for Nonviable Pregnancy Early in the First Trimester. Malva Limes Med 2013; 914:7829-56. 2. Fibroid uterus. Electronically Signed   By: Trudie Reed M.D.   On: 08/08/2018 12:59   US Ob Transvaginal  Result Date: 08/08/2018 CLINICAL DATA:  43 year old pregnant female presenting with vaginal bleeding. EXAM: OBSTETRIC <14 WK [place] AND TRANSVAGINAL OB US TECHNIQUE: Both transabdominal and transvaginal ultrasound examinations were performed for complete evaluation of the gestation as well as the maternal uterus, adnexal regions, and pelvic cul-de-sac. Transvaginal technique was performed to assess early pregnancy. COMPARISON:  Ob ultrasound 11/10/2014. FINDINGS: Intrauterine gestational sac: Present Yolk sac:  None. Embryo:  None. Cardiac Activity: None. Heart Rate: N/A MSD: 5.7 mm   5 w   2  d Subchorionic hemorrhage:  None visualized. Maternal uterus/adnexae: Multiple hypoechoic lesions in the uterus, largest of which is in the right-side of the uterine body measuring 2.3 x 1.9 x 1.9  cm, most compatible with multiple fibroids. Ovaries are normal in appearance. No free fluid within the cul-de-sac. IMPRESSION: 1. Probable early intrauterine gestational sac, but no yolk sac, fetal pole, or cardiac activity yet visualized. Recommend follow-up quantitative B-HCG levels and follow-up US in 14 days to assess viability. This recommendation follows SRU consensus guidelines: Diagnostic Criteria for Nonviable Pregnancy Early in the First Trimester. Malva Limes Med 2013; 629:5284-13. 2. Fibroid uterus. Electronically Signed   By: Trudie Reed M.D.   On: 08/08/2018 12:59    Discussed with Dr. Jerene Pitch ultrasound report and she also reviewed images while we discussed  ____________________________________________   PROCEDURES  Procedure(s) performed: None  Procedures  Critical Care performed: No  ____________________________________________   INITIAL IMPRESSION / ASSESSMENT AND PLAN / ED COURSE  Pertinent labs & imaging results that were available during my care of the patient were reviewed by me and considered in my medical decision making (see chart for details).   Patient presents with reassuring examination and vital signs.  Concerns for painless vaginal bleeding during early pregnancy.  hCG, CBC and repeat ultrasound are ordered.  She is hemodynamically stable, nontoxic and does not describe bleeding to the level that I would be concerned about acute blood loss anemia.  Wish to exclude ectopic a previous ultrasound is thought to demonstrate intrauterine pregnancy.   Clinical Course as of Aug 08 2111  Sat Aug 08, 2018  1310 Case and care discussed with Dr. Jerene Pitch (OBGYN). Appears consistent with most likely miscarriage. Reviewed HCGs (falling) and imaging. Dr. Jerene Pitch advises close follow-up with [facility]. Likely miscarriage.    [MQ]    Clinical Course User Index [MQ] Sharyn Creamer, MD    Patient is doing well without any evidence of acute worsening of heavy bleeding or complication during her ER visit.  She is comfortable the plan for for close follow-up and has a follow-up already arranged for this coming week with OB/GYN.  She has her information is happy to reach out with him she*if any concerns in the interim and also I reviewed careful return precautions.  Discussed with the patient that she needs further follow-up but at the present time we are concerned she is at risk for miscarriage.  There are no present findings or pain that would suggest ectopic pregnancy or other major complication at this time. ____________________________________________   FINAL CLINICAL IMPRESSION(S) / ED DIAGNOSES  Final diagnoses:  Threatened  miscarriage        Note:  This document was prepared using Dragon voice recognition software and may include unintentional dictation errors       Sharyn Creamer, MD 08/08/18 2113

## 2018-08-10 ENCOUNTER — Telehealth: Payer: Self-pay | Admitting: Obstetrics & Gynecology

## 2018-08-10 NOTE — Telephone Encounter (Signed)
Patient is calling wanting to know if she needs to take additional time off or is she able to go back to work tomorrow. Please advise

## 2018-08-10 NOTE — Telephone Encounter (Signed)
Ok to return to work

## 2018-08-11 NOTE — Telephone Encounter (Signed)
Routing to CMA to advise patient

## 2018-08-11 NOTE — Telephone Encounter (Signed)
Left message on voice mailbox to let her know she can return to work, if any questions call us back.

## 2018-08-12 ENCOUNTER — Telehealth: Payer: Self-pay | Admitting: Obstetrics & Gynecology

## 2018-08-12 NOTE — Telephone Encounter (Signed)
-----   Message from Nadara Mustard, MD sent at 08/12/2018 11:25 AM EDT ----- Regarding: Keep appt 4/30 but do not make it NOB Enter as follow up, not a NOB chart  Also keep [place] appt this day as scheduled

## 2018-08-12 NOTE — Telephone Encounter (Signed)
Appointment updated.

## 2018-08-13 ENCOUNTER — Other Ambulatory Visit: Payer: Self-pay

## 2018-08-13 ENCOUNTER — Ambulatory Visit
Admission: RE | Admit: 2018-08-13 | Discharge: 2018-08-13 | Disposition: A | Discharge status: Home / Self Care | Payer: 59 | Source: Ambulatory Visit | Attending: Obstetrics & Gynecology | Admitting: Obstetrics & Gynecology

## 2018-08-13 ENCOUNTER — Ambulatory Visit (INDEPENDENT_AMBULATORY_CARE_PROVIDER_SITE_OTHER): Payer: 59 | Admitting: Obstetrics & Gynecology

## 2018-08-13 ENCOUNTER — Encounter: Payer: Self-pay | Admitting: Obstetrics & Gynecology

## 2018-08-13 VITALS — Ht 60.0 in | Wt 150.0 lb

## 2018-08-13 DIAGNOSIS — O200 Threatened abortion: Secondary | ICD-10-CM

## 2018-08-13 DIAGNOSIS — Z9851 Tubal ligation status: Secondary | ICD-10-CM | POA: Diagnosis not present

## 2018-08-13 NOTE — Progress Notes (Signed)
Virtual Visit via Telephone Note  I connected with Connie Smith on 08/13/18 at  1:10 PM EDT by telephone and verified that I am speaking with the correct person using two identifiers.   I discussed the limitations, risks, security and privacy concerns of performing an evaluation and management service by telephone and the availability of in person appointments. I also discussed with the patient that there may be a patient responsible charge related to this service. The patient expressed understanding and agreed to proceed. She was at home and I was in my office.  History of Present Illness: Threatened Abortion Patient at early gestation by LMP.  Patient reports bleeding and intermittent crampy abdominal pains.  Prior BTL.  Recent beta drop from 2200 to 1300.  [place] (serial) with second one today showing lessening of gest sac and contents within uterus; no adnexal mass.   She is not in acute distress.  Ectopic risks include previous sterilization. Cycle length is regular.   PMHx: She  has a past medical history of History of chicken pox, Medical history non-contributory, and Thyroid disease. Also,  has a past surgical history that includes Myomectomy; Cesarean section (N/A, 11/15/2014); LEEP; and Laparoscopic tubal ligation (Bilateral, 01/20/2015)., family history includes Arthritis in her maternal grandfather, maternal grandmother, and paternal grandmother; Diabetes in her maternal grandfather, maternal grandmother, and paternal grandmother; Hypertension in her father, maternal grandfather, maternal grandmother, mother, and paternal grandmother.,  reports that she has never smoked. She has never used smokeless tobacco. She reports that she does not drink alcohol or use drugs.  She currently has no medications in their medication list. Also, has No Known Allergies.  Review of Systems  All other systems reviewed and are negative.  US Ob Less Than 14 Weeks With Ob Transvaginal  Result Date:  08/13/2018 CLINICAL DATA:  43 year old pregnant female with reported vaginal bleeding since 08/04/2018. History of tubal ligation. EDC by LMP: 04/04/2019, projecting to an expected gestational age of [redacted] weeks 4 days EXAM: OBSTETRIC <14 WK [place] AND TRANSVAGINAL OB US TECHNIQUE: Both transabdominal and transvaginal ultrasound examinations were performed for complete evaluation of the gestation as well as the maternal uterus, adnexal regions, and pelvic cul-de-sac. Transvaginal technique was performed to assess early pregnancy. COMPARISON:  08/03/2018 obstetric scan. FINDINGS: Enlarged myomatous uterus. Representative intramural 2.5 x 2.4 x 2.3 cm posterior right uterine body fibroid. Representative 2.0 x 1.9 x 2.2 cm intramural posterior mid uterine body fibroid. Representative posterior left uterine body subserosal 1.7 x 1.3 x 1.6 cm fibroid. There is a large nabothian cyst in the cervix measuring 3.3 x 1.3 x 2.4 cm, unchanged from prior scan. The uterine cavity is very poorly visualized due to a combination of uterine enlargement, uterine position (anteverted and slightly retroflexed) and myometrial heterogeneity. There is a tiny 7 x 6 x 6 mm cystic structure in the uterine cavity without appreciable embryo, yolk sac or embryonic cardiac activity. Left ovary measures 3.0 x 2.2 x 3.1 cm and appears normal. Right ovary not visualized. No adnexal masses demonstrated. No abnormal free fluid in the pelvis. IMPRESSION: Pregnancy is not definitively localized on this scan. Enlarged myomatous uterus with very poor visualization of the uterine cavity, see comments. Tiny nonspecific 7 mm cystic structure in the uterine cavity without definitive features of pregnancy such as a yolk sac or embryo. No adnexal masses. Sonographic differential diagnosis continues to include intrauterine gestation, spontaneous abortion or occult ectopic gestation. Recommend close clinical follow-up and serial serum beta HCG monitoring, with repeat  obstetric scan as warranted by beta HCG levels and clinical assessment. Electronically Signed   By: Delbert PhenixJason A Poff M.D.   On: 08/13/2018 11:30    Observations/Objective: No exam today, due to telephone eVisit due to [facility] restriction on elective visits and procedures.  Prior visits reviewed along with ultrasounds/labs as indicated.  Assessment and Plan: 1. Threatened abortion Allow time for body to finish miscarriage on its own.  Monitor for heavy bleeding or pain.  Medicine and surgical options discussed.  Contraception discussed, as prior Lap BTL has shown failure.  Repeat laparoscopy with salingectomy, vs IUD and alternative contraception discussed.  Follow Up Instructions: ANnual due w PAP and MMG soon.   I discussed the assessment and treatment plan with the patient. The patient was provided an opportunity to ask questions and all were answered. The patient agreed with the plan and demonstrated an understanding of the instructions.   The patient was advised to call back or seek an in-person evaluation if the symptoms worsen or if the condition fails to improve as anticipated.  I provided 12 minutes of non-face-to-face time during this encounter.   Letitia Libraobert Paul Dariana Garbett, MD [facility], [facility] 08/13/2018  1:42 PM

## 2018-09-22 ENCOUNTER — Other Ambulatory Visit (HOSPITAL_COMMUNITY)
Admission: RE | Admit: 2018-09-22 | Discharge: 2018-09-22 | Disposition: A | Discharge status: Home / Self Care | Payer: 59 | Source: Ambulatory Visit | Attending: Obstetrics & Gynecology | Admitting: Obstetrics & Gynecology

## 2018-09-22 ENCOUNTER — Other Ambulatory Visit: Payer: Self-pay

## 2018-09-22 ENCOUNTER — Encounter: Payer: Self-pay | Admitting: Obstetrics & Gynecology

## 2018-09-22 ENCOUNTER — Ambulatory Visit (INDEPENDENT_AMBULATORY_CARE_PROVIDER_SITE_OTHER): Payer: 59 | Admitting: Obstetrics & Gynecology

## 2018-09-22 VITALS — BP 120/80 | Ht 60.0 in | Wt 158.0 lb

## 2018-09-22 DIAGNOSIS — Z124 Encounter for screening for malignant neoplasm of cervix: Secondary | ICD-10-CM | POA: Diagnosis not present

## 2018-09-22 DIAGNOSIS — Z01419 Encounter for gynecological examination (general) (routine) without abnormal findings: Secondary | ICD-10-CM

## 2018-09-22 DIAGNOSIS — Z1239 Encounter for other screening for malignant neoplasm of breast: Secondary | ICD-10-CM

## 2018-09-22 NOTE — Progress Notes (Signed)
HPI:      [name] is a 43 y.o. [phone] who LMP was Patient's last menstrual period was 09/17/2018., she presents today for her annual examination. The patient has no complaints today. The patient is sexually active. Her last pap: approximate date 2017 and was normal and last mammogram: patient has never had a mammogram. The patient does perform self breast exams.  There is no notable family history of breast or ovarian cancer in her family.  The patient has regular exercise: yes.  The patient denies current symptoms of depression.  Recent miscarriage  GYN History: Contraception: none and she has had prior BTL but then was recentl ypregnant w miscarriage.  PMHx: Past Medical History:  Diagnosis Date  . History of chicken pox   . Medical history non-contributory   . Thyroid disease    Past Surgical History:  Procedure Laterality Date  . CESAREAN SECTION N/A 11/15/2014   Procedure: CESAREAN SECTION;  Surgeon: Nadara Mustardobert P Humphrey Guerreiro, MD;  Location: [facility] ORS;  Service: Obstetrics;  Laterality: N/A;  . LAPAROSCOPIC TUBAL LIGATION Bilateral 01/20/2015   Procedure: LAPAROSCOPIC TUBAL LIGATION;  Surgeon: Nadara Mustardobert P Radwan Cowley, MD;  Location: [facility] OR[facility];  Service: Gynecology;  Laterality: Bilateral;  with hulka clips  . LEEP    . MYOMECTOMY     Family History  Problem Relation Age of Onset  . Hypertension Mother   . Hypertension Father   . Arthritis Maternal Grandmother   . Diabetes Maternal Grandmother   . Hypertension Maternal Grandmother   . Arthritis Maternal Grandfather   . Diabetes Maternal Grandfather   . Hypertension Maternal Grandfather   . Arthritis Paternal Grandmother   . Diabetes Paternal Grandmother   . Hypertension Paternal Grandmother    Social History   Tobacco Use  . Smoking status: Never Smoker  . Smokeless tobacco: Never Used  Substance Use Topics  . Alcohol use: No  . Drug use: No   No current outpatient medications on file. Allergies: Patient has no known  allergies.  Review of Systems  Constitutional: Negative for chills, fever and malaise/fatigue.  HENT: Negative for congestion, sinus pain and sore throat.   Eyes: Negative for blurred vision and pain.  Respiratory: Negative for cough and wheezing.   Cardiovascular: Negative for chest pain and leg swelling.  Gastrointestinal: Negative for abdominal pain, constipation, diarrhea, heartburn, nausea and vomiting.  Genitourinary: Negative for dysuria, frequency, hematuria and urgency.  Musculoskeletal: Negative for back pain, joint pain, myalgias and neck pain.  Skin: Negative for itching and rash.  Neurological: Negative for dizziness, tremors and weakness.  Endo/Heme/Allergies: Does not bruise/bleed easily.  Psychiatric/Behavioral: Negative for depression. The patient is not nervous/anxious and does not have insomnia.     Objective: BP 120/80   Ht 5' (1.524 m)   Wt 158 lb (71.7 kg)   LMP 09/17/2018   BMI 30.86 kg/m   Filed Weights   09/22/18 1542  Weight: 158 lb (71.7 kg)   Body mass index is 30.86 kg/m. Physical Exam Constitutional:      General: She is not in acute distress.    Appearance: She is well-developed.  Genitourinary:     Pelvic exam was performed with patient supine.     Vagina, uterus and rectum normal.     No lesions in the vagina.     No vaginal bleeding.     No cervical motion tenderness, friability, lesion or polyp.     Uterus is mobile.     Uterus is not  enlarged.     No uterine mass detected.    Uterus is midaxial.     No right or left adnexal mass present.     Right adnexa not tender.     Left adnexa not tender.  HENT:     Head: Normocephalic and atraumatic. No laceration.     Right Ear: Hearing normal.     Left Ear: Hearing normal.     Mouth/Throat:     Pharynx: Uvula midline.  Eyes:     Pupils: Pupils are equal, round, and reactive to light.  Neck:     Musculoskeletal: Normal range of motion and neck supple.     Thyroid: No thyromegaly.   Cardiovascular:     Rate and Rhythm: Normal rate and regular rhythm.     Heart sounds: No murmur. No friction rub. No gallop.   Pulmonary:     Effort: Pulmonary effort is normal. No respiratory distress.     Breath sounds: Normal breath sounds. No wheezing.  Chest:     Breasts:        Right: No mass, skin change or tenderness.        Left: No mass, skin change or tenderness.  Abdominal:     General: Bowel sounds are normal. There is no distension.     Palpations: Abdomen is soft.     Tenderness: There is no abdominal tenderness. There is no rebound.  Musculoskeletal: Normal range of motion.  Neurological:     Mental Status: She is alert and oriented to person, place, and time.     Cranial Nerves: No cranial nerve deficit.  Skin:    General: Skin is warm and dry.  Psychiatric:        Judgment: Judgment normal.  Vitals signs reviewed.     Assessment:  ANNUAL EXAM 1. Women's annual routine gynecological examination   2. Screening for cervical cancer   3. Screening for breast cancer    Screening Plan:            1.  Cervical Screening-  Pap smear done today  2. Breast screening- Exam annually and mammogram>40 planned   3. Colonoscopy every 10 years, Hemoccult testing - after age 64  4. Labs managed by PCP  5. Counseling for contraception: Counseled that she needs contraception as she may become pregnant.  If pregnancy desired, there are risk factors for ectopic as well as miscarriage and genetics based on age.  Consider Lap salpingectomies     F/U  Return in about 1 year (around 09/22/2019) for Annual.  Barnett Applebaum, MD, [facility], [facility] 09/22/2018  4:19 PM

## 2018-09-22 NOTE — Patient Instructions (Addendum)
PAP every three years Mammogram every year    Call [phone] to schedule at [place] Colonoscopy every 10 years after age 41 Labs yearly (with PCP) 

## 2018-09-24 LAB — CYTOLOGY - PAP
Diagnosis: NEGATIVE
HPV: NOT DETECTED

## 2019-02-26 ENCOUNTER — Other Ambulatory Visit: Payer: Self-pay

## 2019-02-26 DIAGNOSIS — Z20822 Contact with and (suspected) exposure to covid-19: Secondary | ICD-10-CM

## 2019-03-01 LAB — NOVEL CORONAVIRUS, NAA: SARS-CoV-2, NAA: NOT DETECTED

## 2019-06-12 ENCOUNTER — Ambulatory Visit: Payer: 59 | Attending: Internal Medicine

## 2019-06-12 DIAGNOSIS — Z23 Encounter for immunization: Secondary | ICD-10-CM

## 2019-06-12 NOTE — Progress Notes (Signed)
   Covid-19 Vaccination Clinic  Name:  Connie Smith    MRN: [id number] DOB: 04-05-1976  06/12/2019  [name] was observed post Covid-19 immunization for 15 minutes without incidence. She was provided with Vaccine Information Sheet and instruction to access the V-Safe system.   [name] was instructed to call 911 with any severe reactions post vaccine: Marland Kitchen Difficulty breathing  . Swelling of your face and throat  . A fast heartbeat  . A bad rash all over your body  . Dizziness and weakness    Immunizations Administered    Name Date Dose VIS Date Route   Moderna COVID-19 Vaccine 06/12/2019 12:38 PM 0.5 mL 03/16/2019 Intramuscular   Manufacturer: Moderna   Lot: [id number]   NDC: 28003-491-79

## 2019-07-10 ENCOUNTER — Ambulatory Visit: Payer: 59 | Attending: Internal Medicine

## 2019-07-10 DIAGNOSIS — Z23 Encounter for immunization: Secondary | ICD-10-CM

## 2019-07-10 NOTE — Progress Notes (Signed)
   Covid-19 Vaccination Clinic  Name:  Connie Smith    MRN: [id number] DOB: Nov 29, 1975  07/10/2019  [name] was observed post Covid-19 immunization for 15 minutes without incident. She was provided with Vaccine Information Sheet and instruction to access the V-Safe system.   [name] was instructed to call 911 with any severe reactions post vaccine: Marland Kitchen Difficulty breathing  . Swelling of face and throat  . A fast heartbeat  . A bad rash all over body  . Dizziness and weakness   Immunizations Administered    Name Date Dose VIS Date Route   Moderna COVID-19 Vaccine 07/10/2019 12:07 PM 0.5 mL 03/16/2019 Intramuscular   Manufacturer: Gala Murdoch   Lot: [id number]   NDC: 89483-475-83

## 2019-09-29 IMAGING — US OBSTETRIC <14 WK US AND TRANSVAGINAL OB US
1 series · 13 of 28 positions shown · non-contrast
Comparison: None.

CLINICAL DATA: History of tubal ligation with positive pregnancy
test

EXAM:
OBSTETRIC <14 WK US AND TRANSVAGINAL OB US
TECHNIQUE: Both transabdominal and transvaginal ultrasound examinations were
performed for complete evaluation of the gestation as well as the
maternal uterus, adnexal regions, and pelvic cul-de-sac.
Transvaginal technique was performed to assess early pregnancy.

[Series 1: obstetric <14 wk us and transvaginal ob us · 0.21mm/px · 13 of 107 slices shown]
[im 4/107]
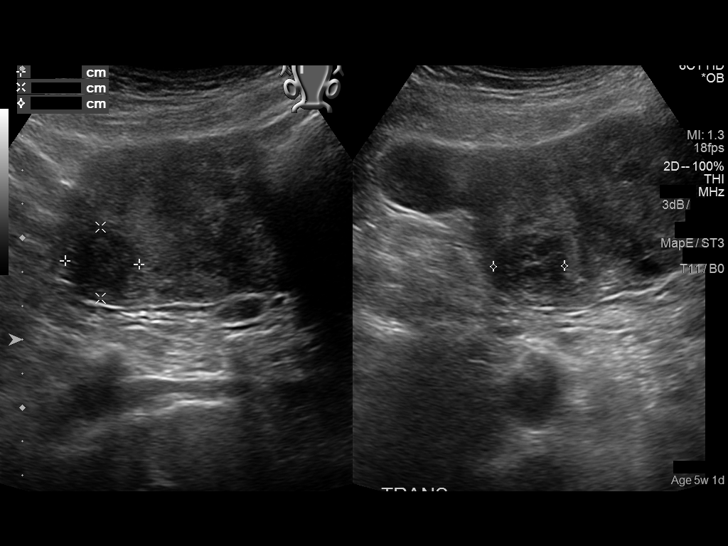
[im 12/107]
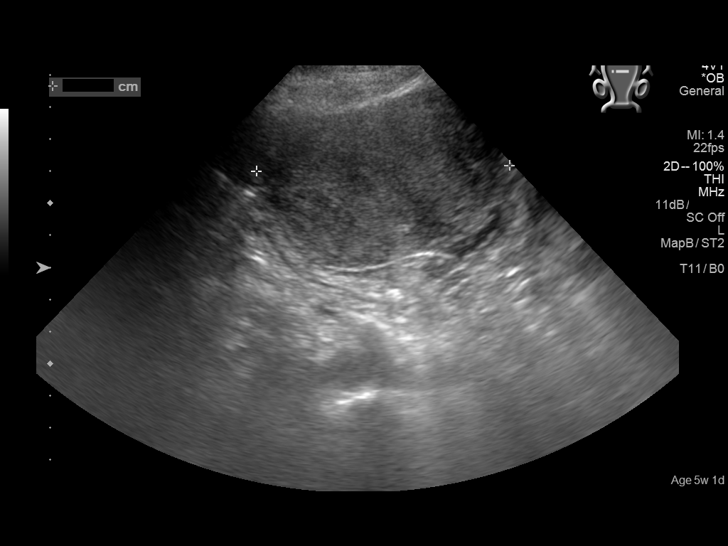
[im 20/107]
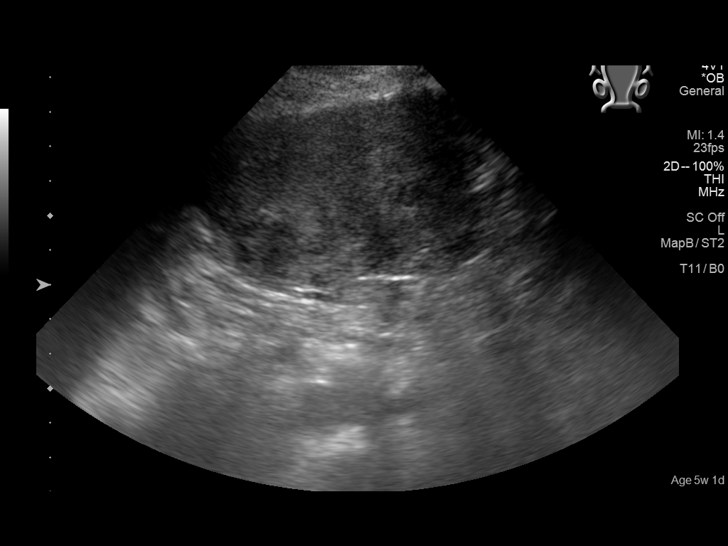
[im 28/107]
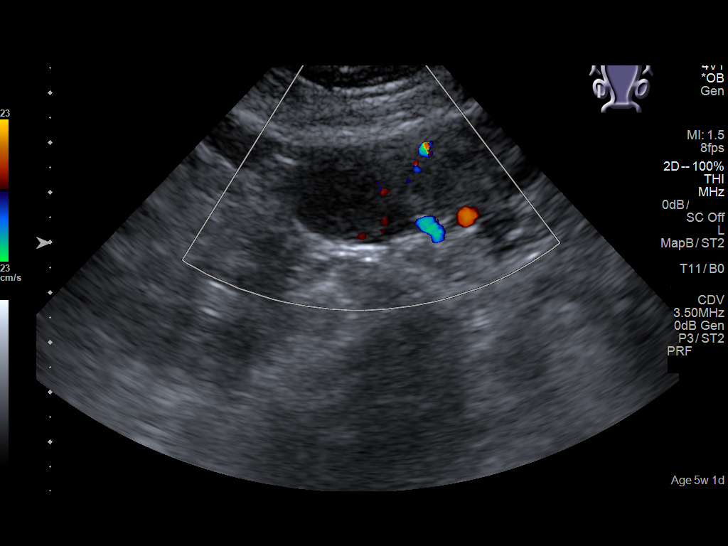
[im 36/107]
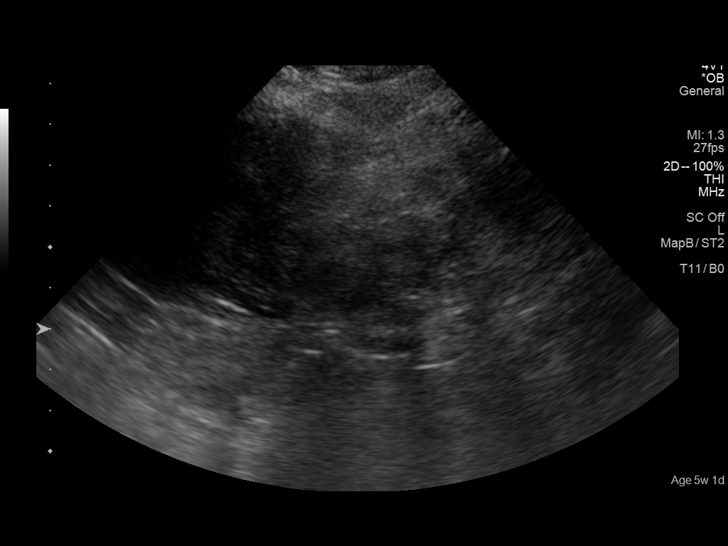
[im 44/107]
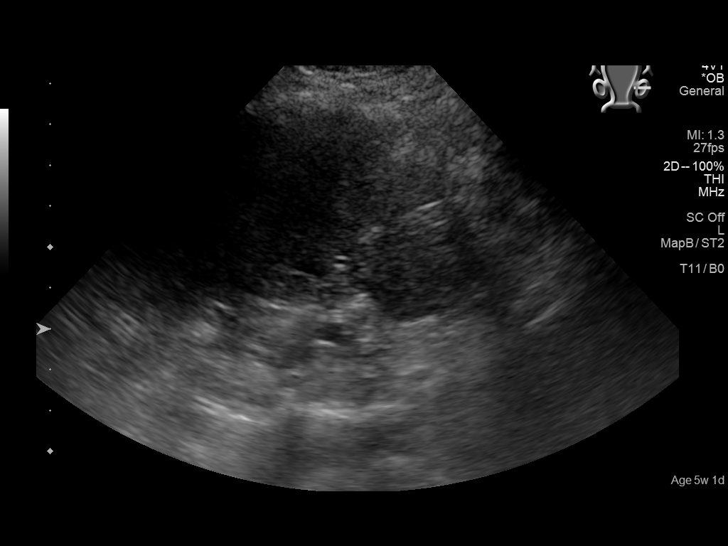
[im 55/107]
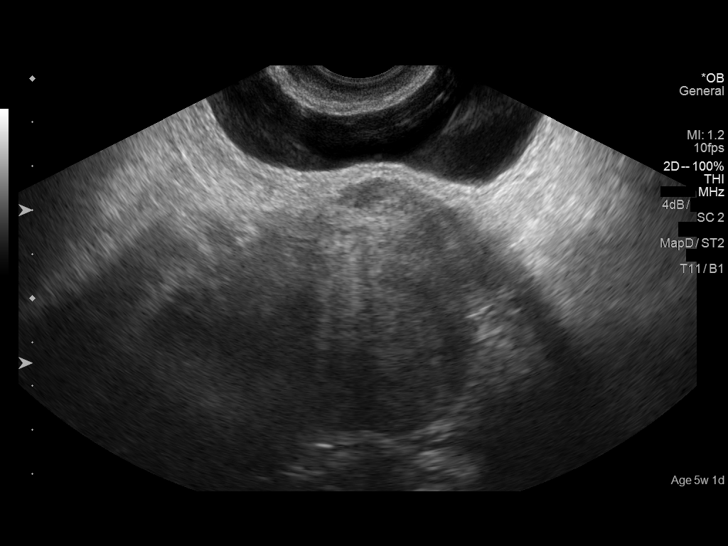
[im 63/107]
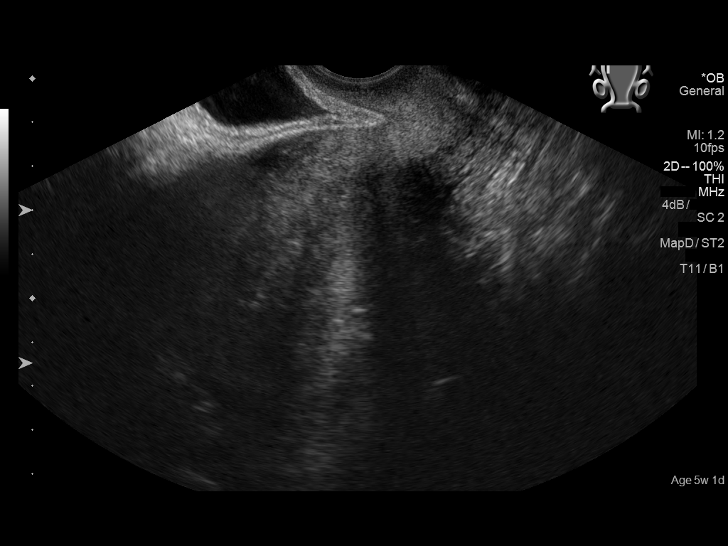
[im 71/107]
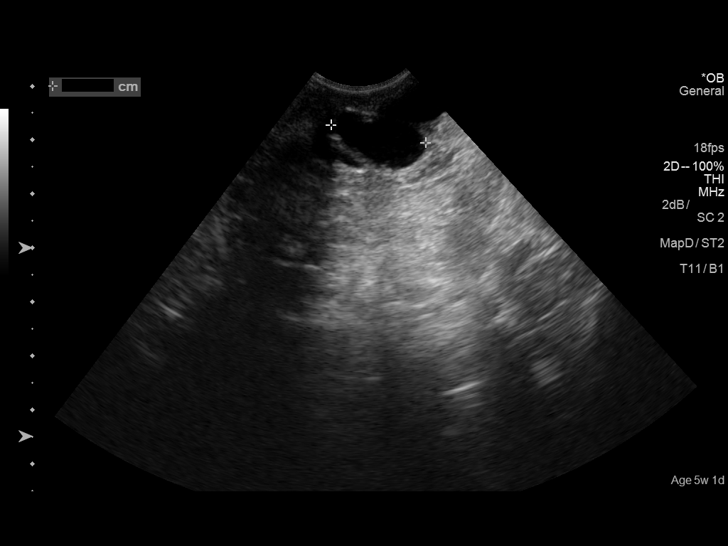
[im 79/107]
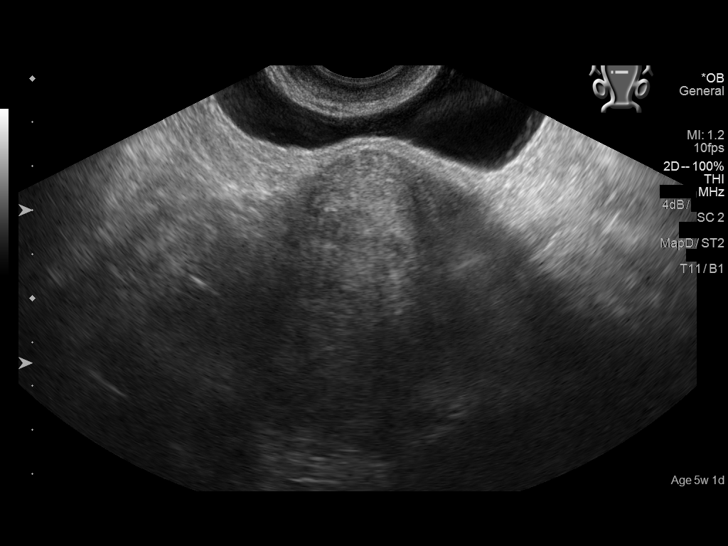
[im 87/107]
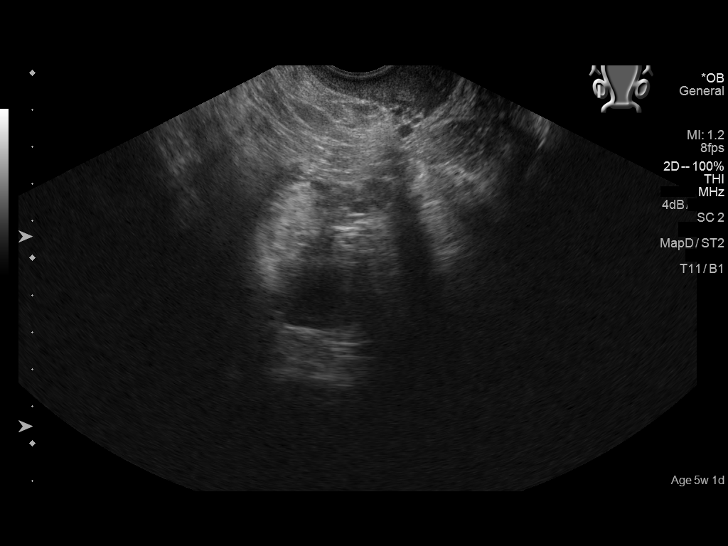
[im 95/107]
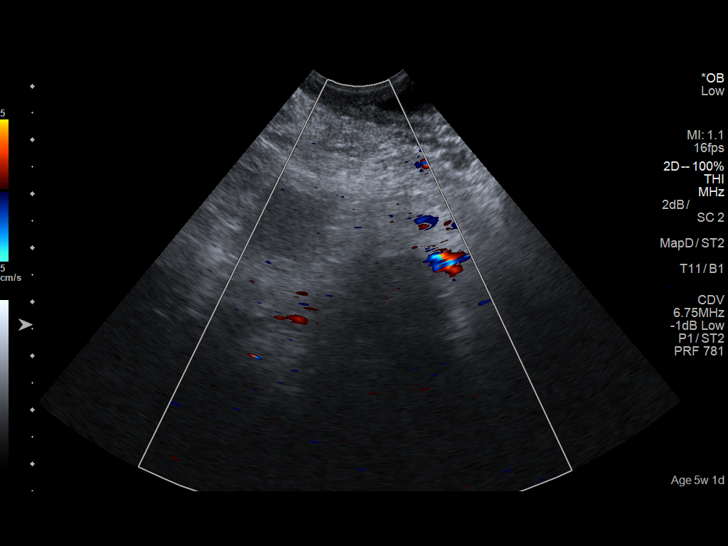
[im 103/107]
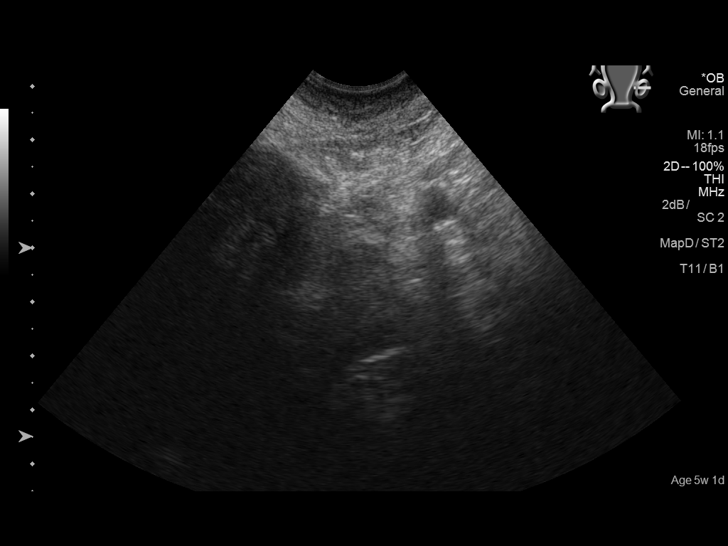

[13 of 28 positions shown; findings below may reference images not displayed]

FINDINGS: Intrauterine gestational sac: Hypoechoic focus is noted within the
uterus which may represent an extremely early gestational sac the
uterine views are somewhat distorted by multiple uterine fibroids.
No definitive fetal pole is noted at this time.

Subchorionic hemorrhage:  None visualized.

Maternal uterus/adnexae: Ovaries are well visualized and within
normal limits.Nabothian cysts are seen. Multiple uterine fibroids
are noted. The largest of these measures 2.2 cm.
IMPRESSION: Probable early intrauterine gestational sac, but no yolk sac, fetal
pole, or cardiac activity yet visualized. Recommend follow-up
quantitative B-HCG levels and follow-up US in 14 days to assess
viability. This recommendation follows SRU consensus guidelines:
Diagnostic Criteria for Nonviable Pregnancy Early in the First
Trimester. N Engl J Med 1310; [DATE].

## 2019-10-09 IMAGING — US OBSTETRIC <14 WK US AND TRANSVAGINAL OB US
1 series · 13 of 28 positions shown · non-contrast
Comparison: 08/03/2018 obstetric scan.

CLINICAL DATA: 43-year-old pregnant female with reported vaginal
bleeding since 08/04/2018. History of tubal ligation.

EDC by LMP: 04/04/2019, projecting to an expected gestational age of
6 weeks 4 days
EXAM:
OBSTETRIC <14 WK US AND TRANSVAGINAL OB US
TECHNIQUE: Both transabdominal and transvaginal ultrasound examinations were
performed for complete evaluation of the gestation as well as the
maternal uterus, adnexal regions, and pelvic cul-de-sac.
Transvaginal technique was performed to assess early pregnancy.

[Series 1: obstetric <14 wk us and transvaginal ob us · 0.22mm/px · 13 of 101 slices shown]
[im 4/101]
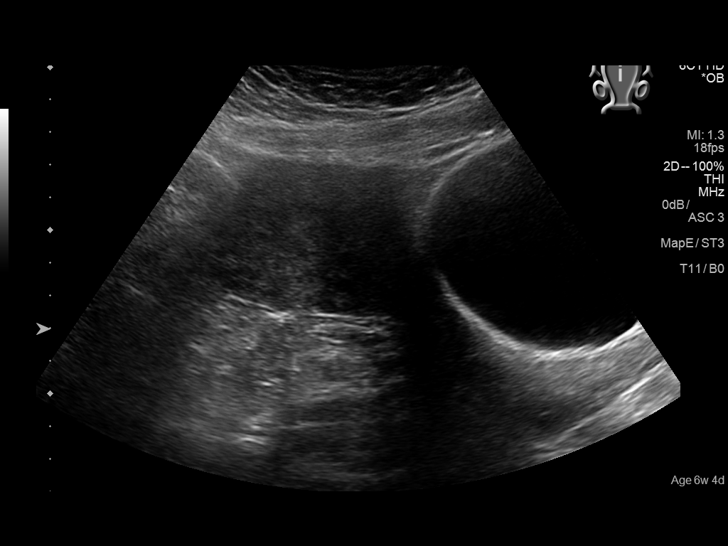
[im 12/101]
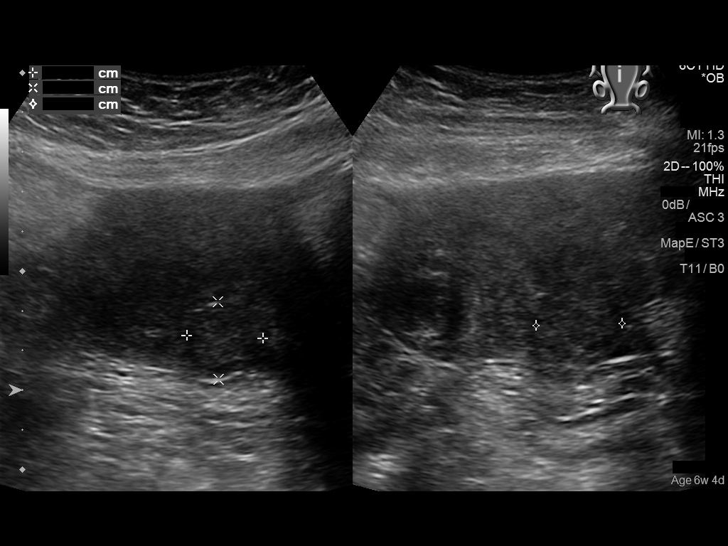
[im 19/101]
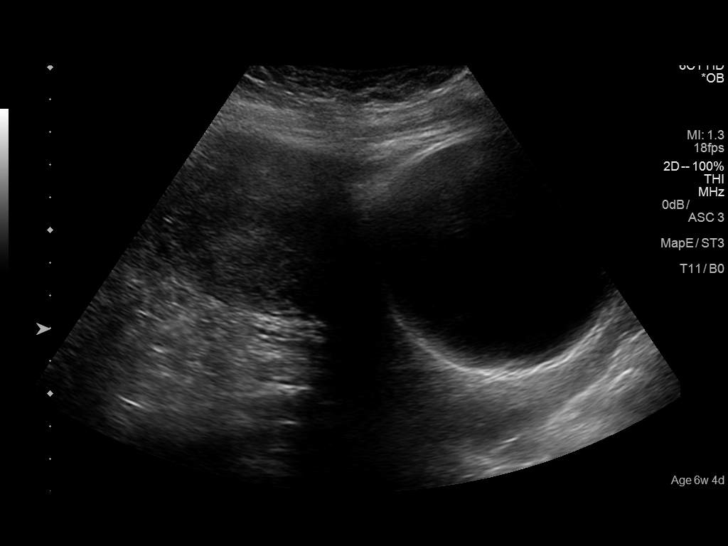
[im 26/101]
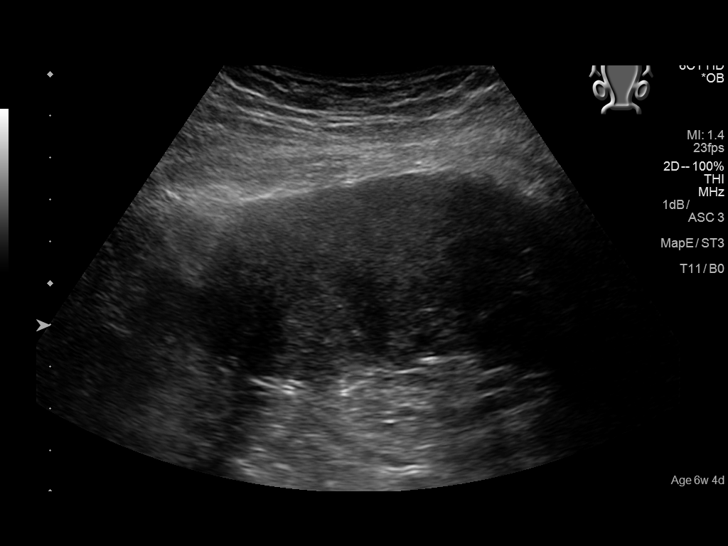
[im 34/101]
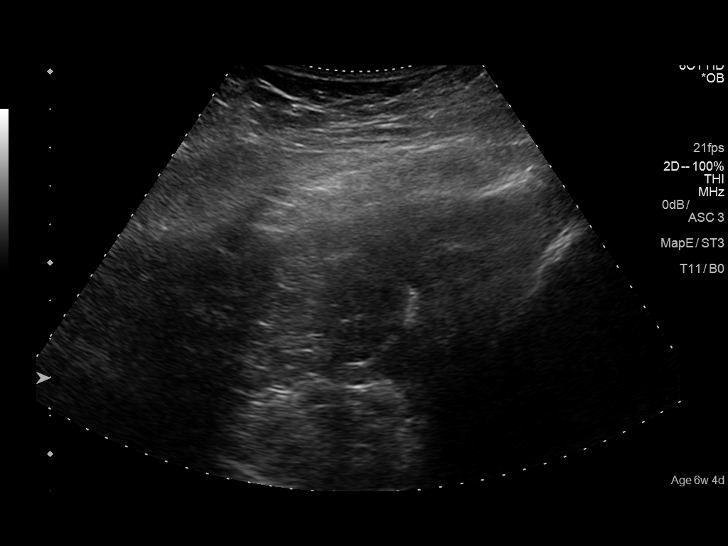
[im 41/101]
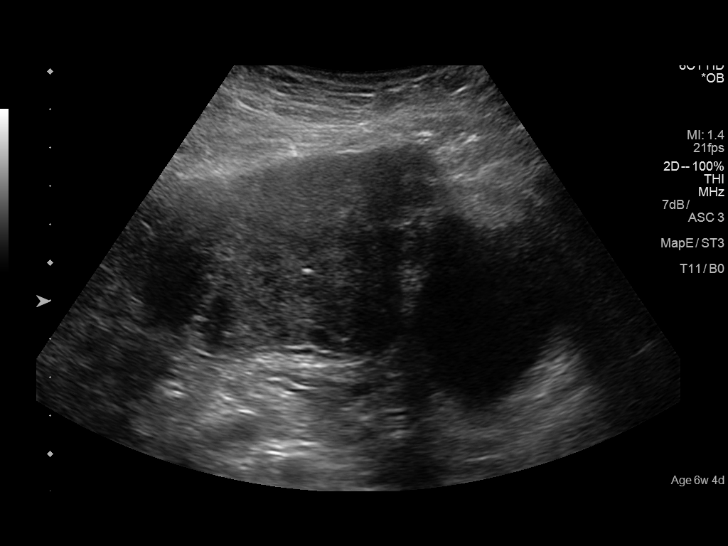
[im 52/101]
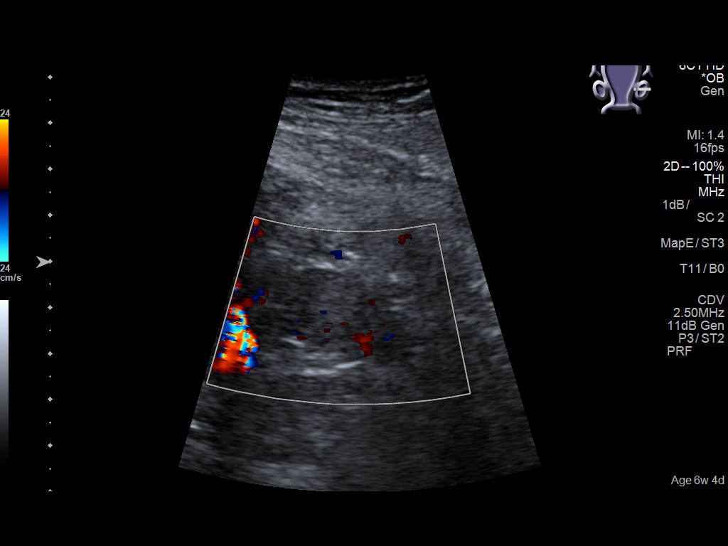
[im 60/101]
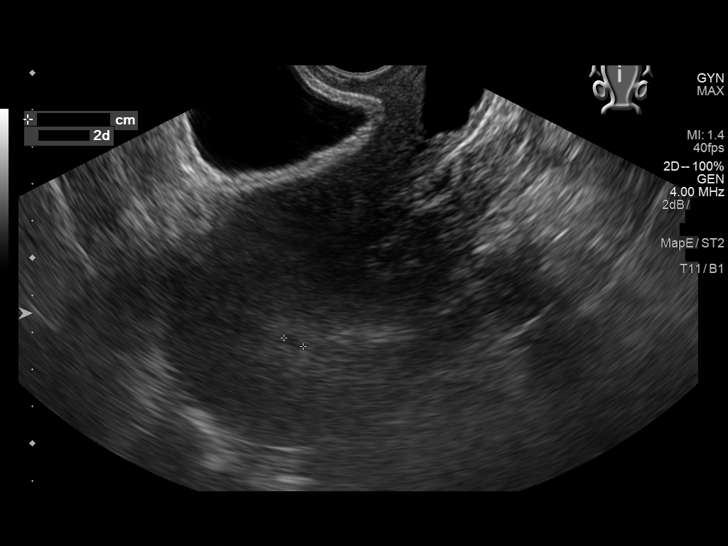
[im 67/101]
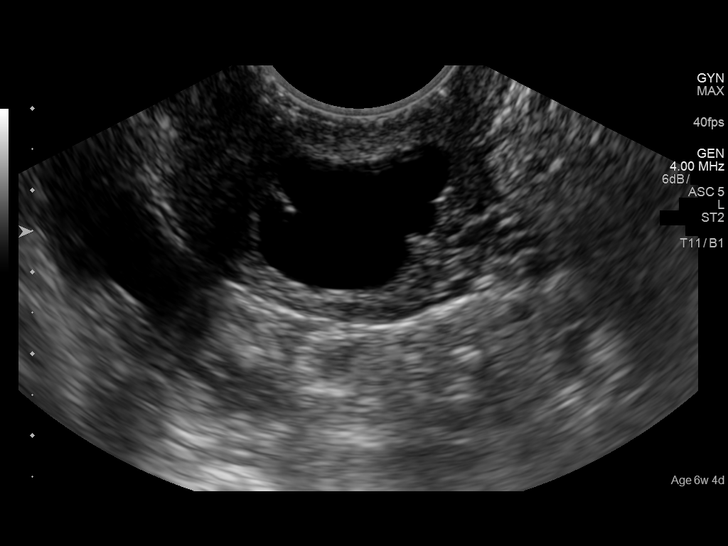
[im 75/101]
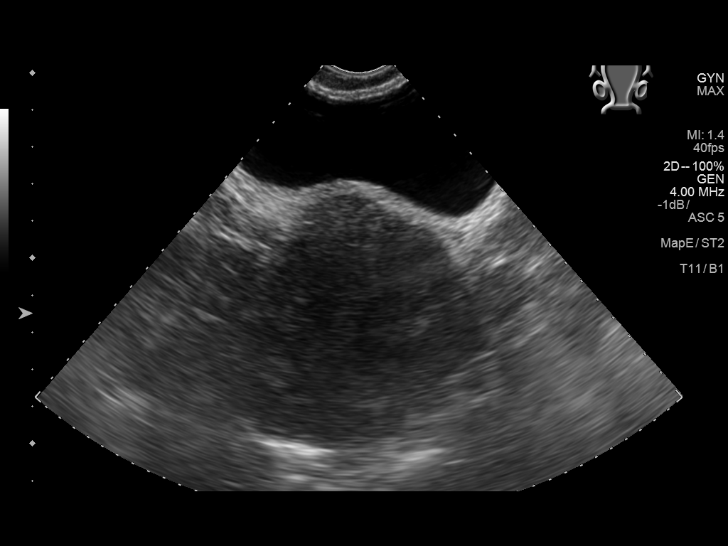
[im 82/101]
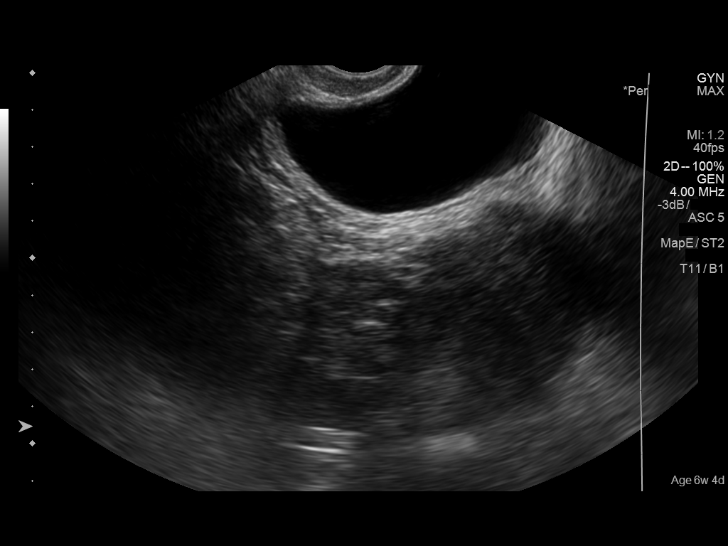
[im 89/101]
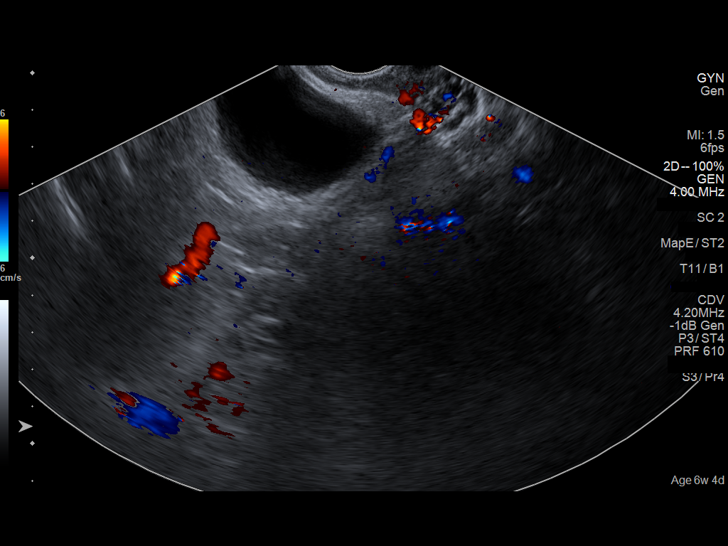
[im 97/101]
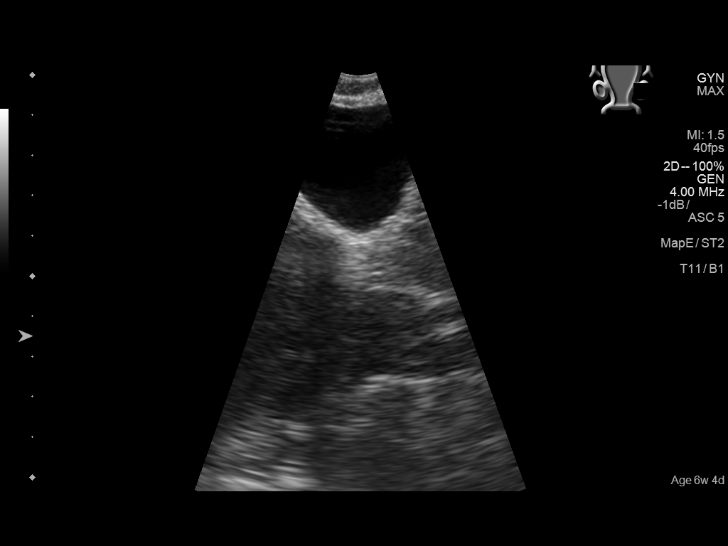

[13 of 28 positions shown; findings below may reference images not displayed]

FINDINGS: Enlarged myomatous uterus. Representative intramural 2.5 x 2.4 x
cm posterior right uterine body fibroid. Representative 2.0 x 1.9 x
2.2 cm intramural posterior mid uterine body fibroid. Representative
posterior left uterine body subserosal 1.7 x 1.3 x 1.6 cm fibroid.
There is a large nabothian cyst in the cervix measuring 3.3 x 1.3 x
2.4 cm, unchanged from prior scan.

The uterine cavity is very poorly visualized due to a combination of
uterine enlargement, uterine position (anteverted and slightly
retroflexed) and myometrial heterogeneity. There is a tiny 7 x 6 x 6
mm cystic structure in the uterine cavity without appreciable
embryo, yolk sac or embryonic cardiac activity.

Left ovary measures 3.0 x 2.2 x 3.1 cm and appears normal. Right
ovary not visualized. No adnexal masses demonstrated. No abnormal
free fluid in the pelvis.
IMPRESSION: Pregnancy is not definitively localized on this scan. Enlarged
myomatous uterus with very poor visualization of the uterine cavity,
see comments. Tiny nonspecific 7 mm cystic structure in the uterine
cavity without definitive features of pregnancy such as a yolk sac
or embryo. No adnexal masses. Sonographic differential diagnosis
continues to include intrauterine gestation, spontaneous abortion or
occult ectopic gestation. Recommend close clinical follow-up and
serial serum beta HCG monitoring, with repeat obstetric scan as
warranted by beta HCG levels and clinical assessment.

## 2019-10-20 ENCOUNTER — Encounter: Payer: Self-pay | Admitting: Obstetrics and Gynecology

## 2019-10-20 ENCOUNTER — Ambulatory Visit (INDEPENDENT_AMBULATORY_CARE_PROVIDER_SITE_OTHER): Payer: 59 | Admitting: Obstetrics and Gynecology

## 2019-10-20 ENCOUNTER — Other Ambulatory Visit: Payer: Self-pay

## 2019-10-20 VITALS — BP 142/95 | HR 106 | Ht 60.0 in | Wt 154.0 lb

## 2019-10-20 DIAGNOSIS — R1033 Periumbilical pain: Secondary | ICD-10-CM | POA: Diagnosis not present

## 2019-10-20 DIAGNOSIS — K429 Umbilical hernia without obstruction or gangrene: Secondary | ICD-10-CM | POA: Diagnosis not present

## 2019-10-20 NOTE — Progress Notes (Signed)
Obstetrics & Gynecology Office Visit   Chief Complaint  Patient presents with  . Pelvic Pain    pain center to left side of pelvis, S/P First trimester SAB 07/2018   History of Present Illness: 44 y.o. [id number] female with a history of BTL and subsequent pregnancy with resultant SAB in 07/2018.  She notes the pain is on her left side. She points to her left lower quadrant at first then she starts to describe more issues around her umbilicus. Onset for the past couple of months, off and on, now more bothersome.  The describes the pain like there are little knots and might radiate to around her naval.  Aggravating factors: none.  Alleviating factors: pressing her legs up to her stomach for a while. Passing flatus sometimes helps.  She also notes really bad gas.  She specifically denies fevers, chills, nausea, vomiting, diarrhea, constipation, hematochezia, melena.  She denies urinary frequency, dysuria, hematuria.  Sometimes sexual intercourse causes a little discomfort. She rates the discomfort as a 4/10.  The frequency of her pain every is off and on, sometimes every few days, lasting a couple of hours.   Her LMP was 10/02/2018 - 6/23.  She is doing nothing besides her BTL for contraception. She does have a history of fibroid. Her periods come regularly, monthly.  She states her periods aren't heavy, not passing clots.  She denies abnormal vaginal discharge, no odor.  She denies vaginal itching, burning, and irritation.  She has not had this type of pain in the past.   She notes that she has been told in the past that she might have an umbilical hernia. However, she didn't want to do anything about it because the pain wasn't much of an issue, not like it is now.   Past Medical History:  Diagnosis Date  . History of chicken pox   . Medical history non-contributory   . Thyroid disease     Past Surgical History:  Procedure Laterality Date  . CESAREAN SECTION N/A 11/15/2014   Procedure: CESAREAN  SECTION;  Surgeon: Nadara Mustard, MD;  Location: [facility] ORS;  Service: Obstetrics;  Laterality: N/A;  . LAPAROSCOPIC TUBAL LIGATION Bilateral 01/20/2015   Procedure: LAPAROSCOPIC TUBAL LIGATION;  Surgeon: Nadara Mustard, MD;  Location: [facility] ORS;  Service: Gynecology;  Laterality: Bilateral;  with hulka clips  . LEEP    . MYOMECTOMY      Gynecologic History: Patient's last menstrual period was 10/02/2019.  Obstetric History: [id number]  Family History  Problem Relation Age of Onset  . Hypertension Mother   . Hypertension Father   . Arthritis Maternal Grandmother   . Diabetes Maternal Grandmother   . Hypertension Maternal Grandmother   . Breast cancer Maternal Grandmother   . Arthritis Maternal Grandfather   . Diabetes Maternal Grandfather   . Hypertension Maternal Grandfather   . Arthritis Paternal Grandmother   . Diabetes Paternal Grandmother   . Hypertension Paternal Grandmother   . Breast cancer Other   . Breast cancer Maternal Aunt     Social History   Socioeconomic History  . Marital status: Married    Spouse name: Not on file  . Number of children: Not on file  . Years of education: Not on file  . Highest education level: Not on file  Occupational History  . Not on file  Tobacco Use  . Smoking status: Never Smoker  . Smokeless tobacco: Never Used  Vaping Use  . Vaping Use: Never used  Substance  and Sexual Activity  . Alcohol use: No  . Drug use: No  . Sexual activity: Yes    Birth control/protection: Surgical  Other Topics Concern  . Not on file  Social History Narrative  . Not on file   Social Determinants of Health   Financial Resource Strain:   . Difficulty of Paying Living Expenses:   Food Insecurity:   . Worried About Programme researcher, broadcasting/film/video in the Last Year:   . Barista in the Last Year:   Transportation Needs:   . Freight forwarder (Medical):   Marland Kitchen Lack of Transportation (Non-Medical):   Physical Activity:   . Days of Exercise per Week:    . Minutes of Exercise per Session:   Stress:   . Feeling of Stress :   Social Connections:   . Frequency of Communication with Friends and Family:   . Frequency of Social Gatherings with Friends and Family:   . Attends Religious Services:   . Active Member of Clubs or Organizations:   . Attends Banker Meetings:   Marland Kitchen Marital Status:   Intimate Partner Violence:   . Fear of Current or Ex-Partner:   . Emotionally Abused:   Marland Kitchen Physically Abused:   . Sexually Abused:     No Known Allergies  Prior to Admission medications   Denies    Review of Systems  Constitutional: Negative.  Negative for chills and fever.  HENT: Negative.   Eyes: Negative.   Respiratory: Negative.   Cardiovascular: Negative.   Gastrointestinal: Positive for abdominal pain. Negative for blood in stool, constipation, diarrhea, heartburn, nausea and vomiting.  Genitourinary: Negative.  Negative for dysuria, flank pain, frequency, hematuria and urgency.  Musculoskeletal: Negative.   Skin: Negative.   Neurological: Negative.   Psychiatric/Behavioral: Negative.      Physical Exam BP (!) 142/95   Pulse (!) 106   Ht 5' (1.524 m)   Wt 154 lb (69.9 kg)   LMP 10/02/2019   Breastfeeding Unknown   BMI 30.08 kg/m  Patient's last menstrual period was 10/02/2019. Physical Exam Constitutional:      General: She is not in acute distress.    Appearance: Normal appearance. She is well-developed.  Genitourinary:     Pelvic exam was performed with patient in the lithotomy position.     Vulva, inguinal canal, urethra, bladder, vagina, right adnexa and left adnexa normal.     No posterior fourchette tenderness, injury or lesion present.     No cervical friability, lesion, bleeding or polyp.     Uterus is enlarged, irregular and mobile.     Uterus is not tender.  HENT:     Head: Normocephalic and atraumatic.  Eyes:     General: No scleral icterus.    Conjunctiva/sclera: Conjunctivae normal.    Cardiovascular:     Rate and Rhythm: Normal rate and regular rhythm.     Heart sounds: No murmur heard.  No friction rub. No gallop.   Pulmonary:     Effort: Pulmonary effort is normal. No respiratory distress.     Breath sounds: Normal breath sounds. No wheezing or rales.  Abdominal:     General: Bowel sounds are normal. There is no distension.     Palpations: Abdomen is soft. There is mass (uterus palpable below umbilicus, non-tender).     Tenderness: There is abdominal tenderness (around umbilicus). There is no guarding or rebound.     Hernia: A hernia (possibly, but hard to tell  due to keloids above.) is present.    Musculoskeletal:        General: Normal range of motion.     Cervical back: Normal range of motion and neck supple.  Neurological:     General: No focal deficit present.     Mental Status: She is alert and oriented to person, place, and time.     Cranial Nerves: No cranial nerve deficit.  Skin:    General: Skin is warm and dry.     Findings: No erythema.  Psychiatric:        Mood and Affect: Mood normal.        Behavior: Behavior normal.        Judgment: Judgment normal.     Female chaperone present for pelvic and breast  portions of the physical exam  Assessment: 44 y.o. [id number] female here for  1. Periumbilical abdominal pain   2. Umbilical hernia without obstruction and without gangrene      Plan: Problem List Items Addressed This Visit    None    Visit Diagnoses    Periumbilical abdominal pain    -  Primary   Relevant Orders   Ambulatory referral to General Surgery   Umbilical hernia without obstruction and without gangrene       Relevant Orders   Ambulatory referral to General Surgery     She likely has an umbilical hernia, though I could not say for sure due to lack of tolerance of exam. Given her symptoms and the location of the symptoms, I favor an umbilical hernia, which she states she would like to seek treatment to resolve.  Her uterus  is enlarged and has a previously-established history of uterine fibroids. Her uterus is non-tender. So, this appears less likely.  If general surgery does not think she has a hernia, then could consider imaging her uterus to see if there is an interval change.  It is too early to detect pregnancy. However, she had a pregnancy since her tubal ligation and so she is at risk of future pregnancy.   Referral to general surgery.  If she desires to have a bilateral salpingectomy at the time of hernia repair, this could be pretty easily accomplished.    A total of 40 minutes were spent face-to-face with the patient as well as preparation, review, communication, and documentation during this encounter.     Thomasene Mohair, MD 10/20/2019 12:35 PM

## 2019-10-29 ENCOUNTER — Ambulatory Visit: Payer: 59 | Admitting: Surgery

## 2019-11-04 ENCOUNTER — Other Ambulatory Visit: Payer: 59

## 2019-11-10 ENCOUNTER — Ambulatory Visit: Payer: 59 | Admitting: Surgery

## 2019-11-24 ENCOUNTER — Ambulatory Visit: Payer: 59 | Admitting: Surgery

## 2019-11-24 ENCOUNTER — Other Ambulatory Visit: Payer: Self-pay

## 2019-11-24 ENCOUNTER — Encounter: Payer: Self-pay | Admitting: Surgery

## 2019-11-24 VITALS — BP 134/92 | HR 87 | Temp 99.1°F | Resp 12 | Ht 60.0 in | Wt 156.6 lb

## 2019-11-24 DIAGNOSIS — K432 Incisional hernia without obstruction or gangrene: Secondary | ICD-10-CM

## 2019-11-24 NOTE — Patient Instructions (Addendum)
You have decided to wait to have your hernia repaired. When you decide to proceed with surgery please contact the office to make an appointment to come in.   Call the office if you have any questions or concerns.   Umbilical Hernia, Adult  A hernia is a bulge of tissue that pushes through an opening between muscles. An umbilical hernia happens in the abdomen, near the belly button (umbilicus). The hernia may contain tissues from the small intestine, large intestine, or fatty tissue covering the intestines (omentum). Umbilical hernias in adults tend to get worse over time, and they require surgical treatment. There are several types of umbilical hernias. You may have:  A hernia located just above or below the umbilicus (indirect hernia). This is the most common type of umbilical hernia in adults.  A hernia that forms through an opening formed by the umbilicus (direct hernia).  A hernia that comes and goes (reducible hernia). A reducible hernia may be visible only when you strain, lift something heavy, or cough. This type of hernia can be pushed back into the abdomen (reduced).  A hernia that traps abdominal tissue inside the hernia (incarcerated hernia). This type of hernia cannot be reduced.  A hernia that cuts off blood flow to the tissues inside the hernia (strangulated hernia). The tissues can start to die if this happens. This type of hernia requires emergency treatment. What are the causes? An umbilical hernia happens when tissue inside the abdomen presses on a weak area of the abdominal muscles. What increases the risk? You may have a greater risk of this condition if you:  Are obese.  Have had several pregnancies.  Have a buildup of fluid inside your abdomen (ascites).  Have had surgery that weakens the abdominal muscles. What are the signs or symptoms? The main symptom of this condition is a painless bulge at or near the belly button. A reducible hernia may be visible only when  you strain, lift something heavy, or cough. Other symptoms may include:  Dull pain.  A feeling of pressure. Symptoms of a strangulated hernia may include:  Pain that gets increasingly worse.  Nausea and vomiting.  Pain when pressing on the hernia.  Skin over the hernia becoming red or purple.  Constipation.  Blood in the stool. How is this diagnosed? This condition may be diagnosed based on:  A physical exam. You may be asked to cough or strain while standing. These actions increase the pressure inside your abdomen and force the hernia through the opening in your muscles. Your health care provider may try to reduce the hernia by pressing on it.  Your symptoms and medical history. How is this treated? Surgery is the only treatment for an umbilical hernia. Surgery for a strangulated hernia is done as soon as possible. If you have a small hernia that is not incarcerated, you may need to lose weight before having surgery. Follow these instructions at home:  Lose weight, if told by your health care provider.  Do not try to push the hernia back in.  Watch your hernia for any changes in color or size. Tell your health care provider if any changes occur.  You may need to avoid activities that increase pressure on your hernia.  Do not lift anything that is heavier than 10 lb (4.5 kg) until your health care provider says that this is safe.  Take over-the-counter and prescription medicines only as told by your health care provider.  Keep all follow-up visits as  told by your health care provider. This is important. Contact a health care provider if:  Your hernia gets larger.  Your hernia becomes painful. Get help right away if:  You develop sudden, severe pain near the area of your hernia.  You have pain as well as nausea or vomiting.  You have pain and the skin over your hernia changes color.  You develop a fever. This information is not intended to replace advice given to  you by your health care provider. Make sure you discuss any questions you have with your health care provider. Document Revised: 05/14/2017 Document Reviewed: 09/30/2016 Elsevier Patient Education  2020 [employer].

## 2019-11-24 NOTE — Progress Notes (Signed)
11/24/2019  Reason for Visit:  Umbilical hernia  Referring Provider:  Thomasene Mohair, MD  History of Present Illness: Connie Smith is a 44 y.o. female patient came for evaluation of umbilical hernia.  The patient reports that she has had this for quite some time but is unable to give [place] an exact timeframe.  She thinks it has been at least a year that she has noticed bulging in the umbilicus with sensation of pulling or tugging.  She reports that however over the past 2 months, the symptoms have worsened and is somewhat more tender now.  She reports that if she pulls her legs up to her chest that may help with the discomfort and also after passing gas that also helps with the discomfort.  Otherwise she denies any nausea, vomiting, obstructive symptoms, constipation, diarrhea.  Of note, the patient reports that she has had surgery for fibroids, C-sections, and tubal ligation.  Tubal ligation was laparoscopic, she has had both a midline vertical and Pfannenstiel incisions for her C-sections.  Past Medical History: Past Medical History:  Diagnosis Date  . History of chicken pox   . Medical history non-contributory   . Thyroid disease      Past Surgical History: Past Surgical History:  Procedure Laterality Date  . CESAREAN SECTION N/A 11/15/2014   Procedure: CESAREAN SECTION;  Surgeon: Nadara Mustard, MD;  Location: [facility] ORS;  Service: Obstetrics;  Laterality: N/A;  . LAPAROSCOPIC TUBAL LIGATION Bilateral 01/20/2015   Procedure: LAPAROSCOPIC TUBAL LIGATION;  Surgeon: Nadara Mustard, MD;  Location: [facility] ORS;  Service: Gynecology;  Laterality: Bilateral;  with hulka clips  . LEEP    . MYOMECTOMY      Home Medications: Prior to Admission medications   Not on File    Allergies: No Known Allergies  Social History:  reports that she has never smoked. She has never used smokeless tobacco. She reports that she does not drink alcohol and does not use drugs.   Family History: Family  History  Problem Relation Age of Onset  . Hypertension Mother   . Hypertension Father   . Arthritis Maternal Grandmother   . Diabetes Maternal Grandmother   . Hypertension Maternal Grandmother   . Breast cancer Maternal Grandmother   . Arthritis Maternal Grandfather   . Diabetes Maternal Grandfather   . Hypertension Maternal Grandfather   . Arthritis Paternal Grandmother   . Diabetes Paternal Grandmother   . Hypertension Paternal Grandmother   . Breast cancer Other   . Breast cancer Maternal Aunt     Review of Systems: Review of Systems  Constitutional: Negative for chills and fever.  HENT: Negative for hearing loss.   Eyes: Negative for blurred vision.  Respiratory: Negative for shortness of breath.   Cardiovascular: Negative for chest pain.  Gastrointestinal: Positive for abdominal pain. Negative for constipation, diarrhea, nausea and vomiting.  Genitourinary: Negative for dysuria.  Musculoskeletal: Negative for myalgias.  Skin: Negative for rash.  Neurological: Negative for dizziness.  Psychiatric/Behavioral: Negative for depression.    Physical Exam BP (!) 134/92   Pulse 87   Temp 99.1 F (37.3 C)   Resp 12   Ht 5' (1.524 m)   Wt 156 lb 9.6 oz (71 kg)   SpO2 99%   BMI 30.58 kg/m  CONSTITUTIONAL: No acute distress HEENT:  Normocephalic, atraumatic, extraocular motion intact. NECK: Trachea is midline, and there is no jugular venous distension.  RESPIRATORY:  Lungs are clear, and breath sounds are equal bilaterally. Normal respiratory  effort without pathologic use of accessory muscles. CARDIOVASCULAR: Heart is regular without murmurs, gallops, or rubs. GI: The abdomen is soft, nondistended, with mild discomfort to palpation at the umbilicus.  The patient does have a reducible hernia which I think actually is an incisional hernia based on the location of one of her incisions extending from the umbilicus down towards the pelvis.  She also has at the umbilicus, a large  keloid scar.  MUSCULOSKELETAL:  Normal muscle strength and tone in all four extremities.  No peripheral edema or cyanosis. SKIN: Skin turgor is normal. There are no pathologic skin lesions.  NEUROLOGIC:  Motor and sensation is grossly normal.  Cranial nerves are grossly intact. PSYCH:  Alert and oriented to person, place and time. Affect is normal.  Laboratory Analysis: No results found for this or any previous visit (from the past 24 hour(s)).  Imaging: No results found.  Assessment and Plan: This is a 44 y.o. female with an incisional hernia at the level of the umbilicus from a prior low midline incision.  -Discussed with the patient the different types of hernias ranging from reducible, to incarcerated, to strangulated and the particular symptoms associated with each.  Partially reducible.  Discussed with her that this is most likely an incisional hernia given the location of prior low midline incision. -Given the symptoms, I offered the patient we could proceed with an open incisional hernia repair, likely with mesh in order to reinforce the repair.  At that point, we can also excise the keloid scar that she has at the inferior portion of her umbilicus.  The patient is asking if this could be done next year after May so that any surgery would not interfere with the school year that is about to start.  She is also wondering that she may need to repeat tubal ligation as she has gotten pregnant since her previous tubal ligation and is wondering if our surgery would interfere with the next surgery.  I discussed with her that we could potentially proceed with both surgeries at the same time in a combined setting. -Patient will think about her options and the exact timing of her surgery.  I discussed with her that we could potentially wait until the school year is over, but I did stress with her potential symptoms and signs to look out for of any worsening hernia issues.  She will call us later on to  discuss further timing and when she is ready to schedule surgery.   Face-to-face time spent with the patient and care providers was 40 minutes, with more than 50% of the time spent counseling, educating, and coordinating care of the patient.     Howie Ill, MD [facility]

## 2021-02-11 IMAGING — US OBSTETRIC <14 WK ULTRASOUND
1 series · 13 of 28 positions shown · non-contrast
Comparison: Ob ultrasound 11/10/2014.

CLINICAL DATA: 43-year-old pregnant female presenting with vaginal
bleeding.

EXAM:
OBSTETRIC <14 WK US AND TRANSVAGINAL OB US
TECHNIQUE: Both transabdominal and transvaginal ultrasound examinations were
performed for complete evaluation of the gestation as well as the
maternal uterus, adnexal regions, and pelvic cul-de-sac.
Transvaginal technique was performed to assess early pregnancy.

[Series 1: obstetric <14 wk ultrasound · 13 of 68 slices shown]
[im 3/68]
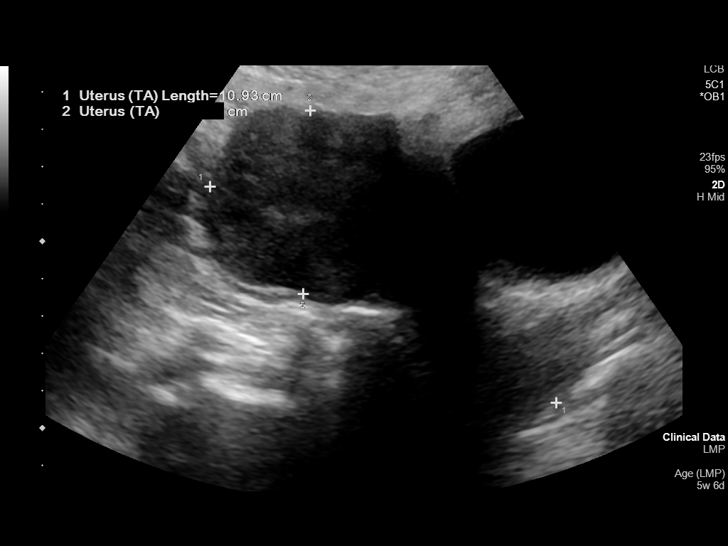
[im 8/68]
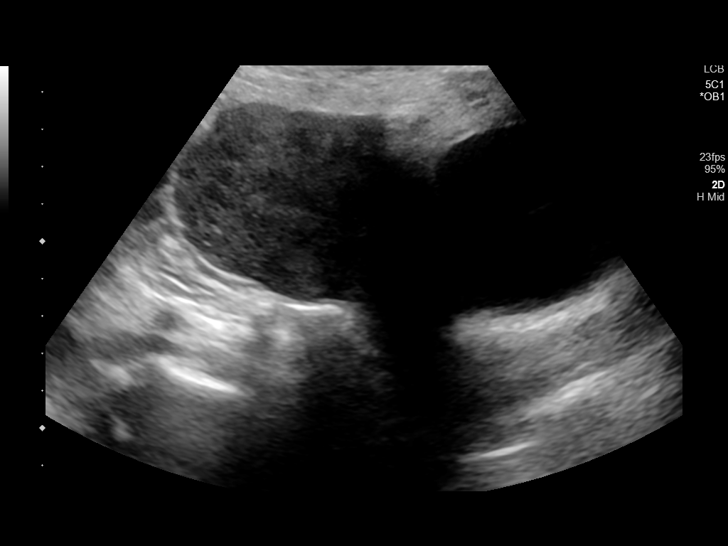
[im 13/68]
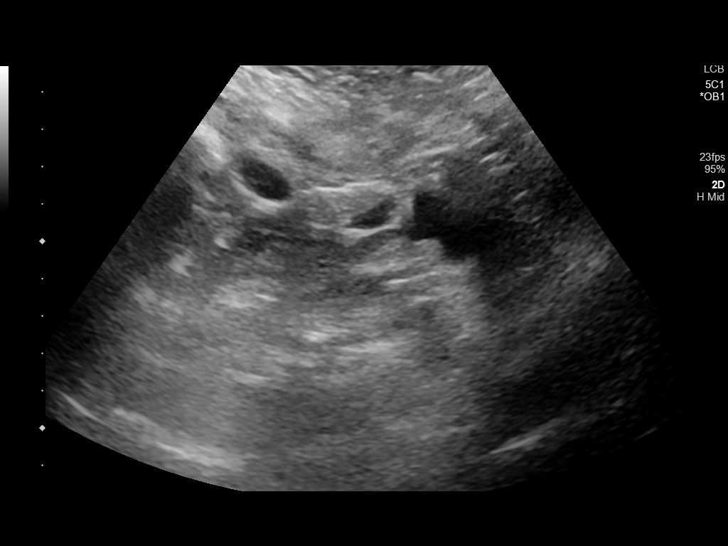
[im 18/68]
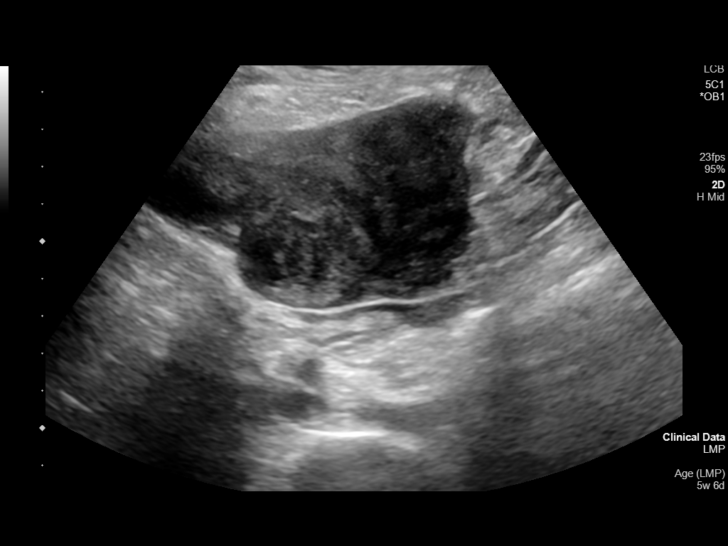
[im 23/68]
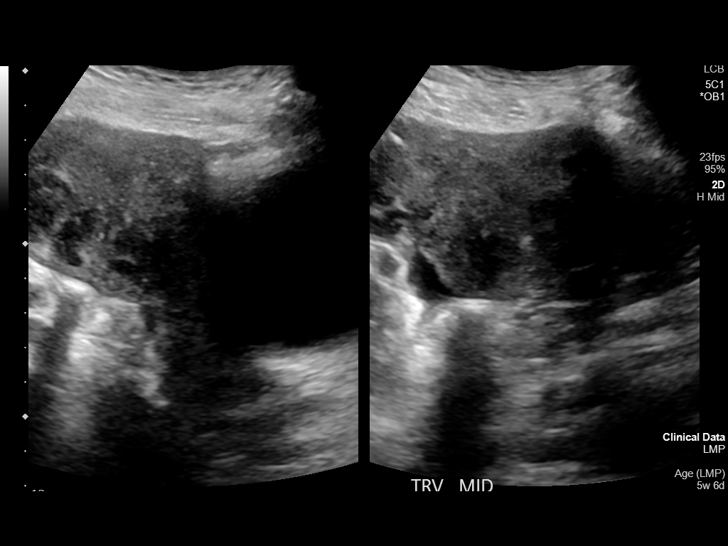
[im 28/68]
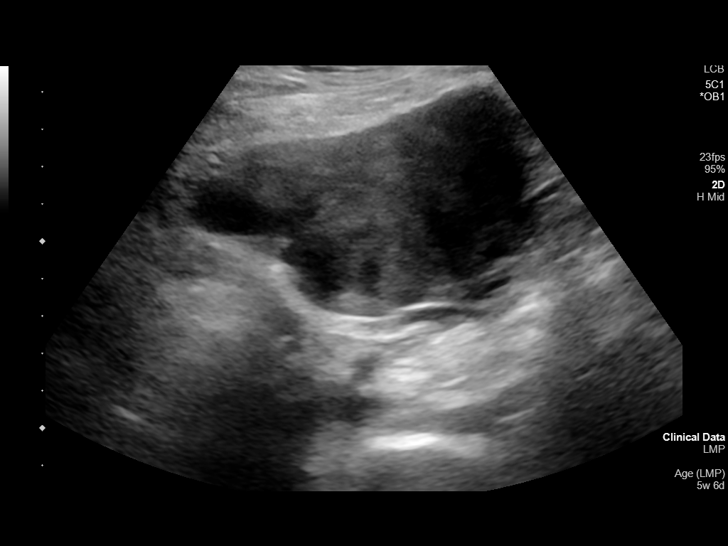
[im 35/68]
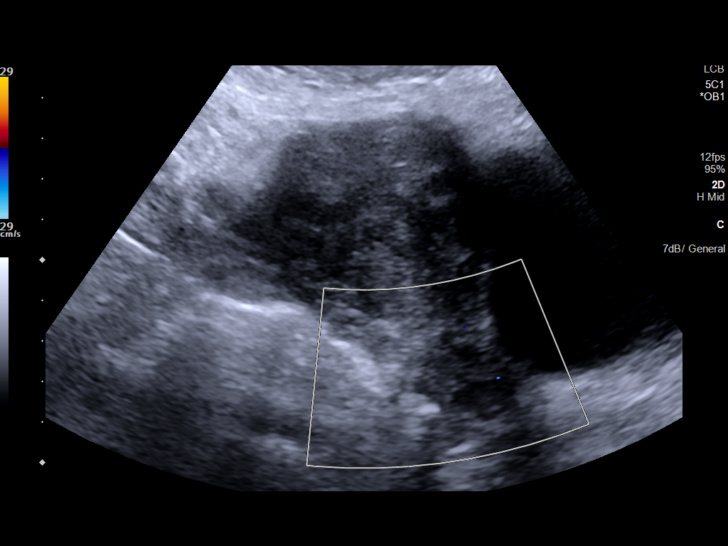
[im 40/68]
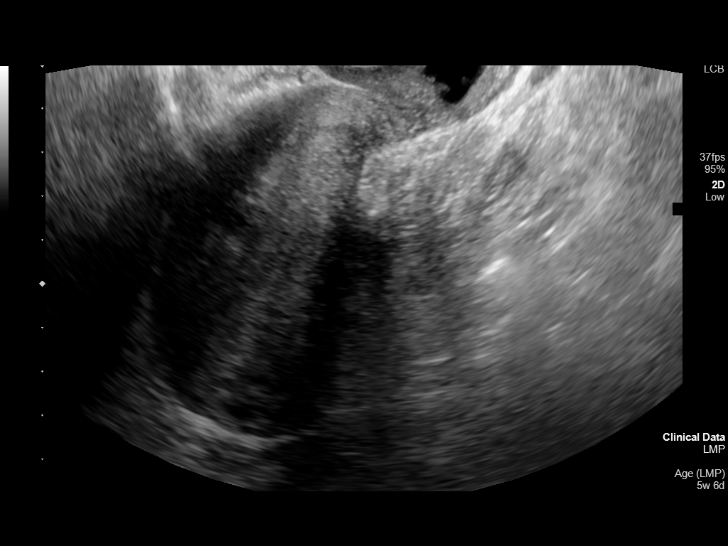
[im 45/68]
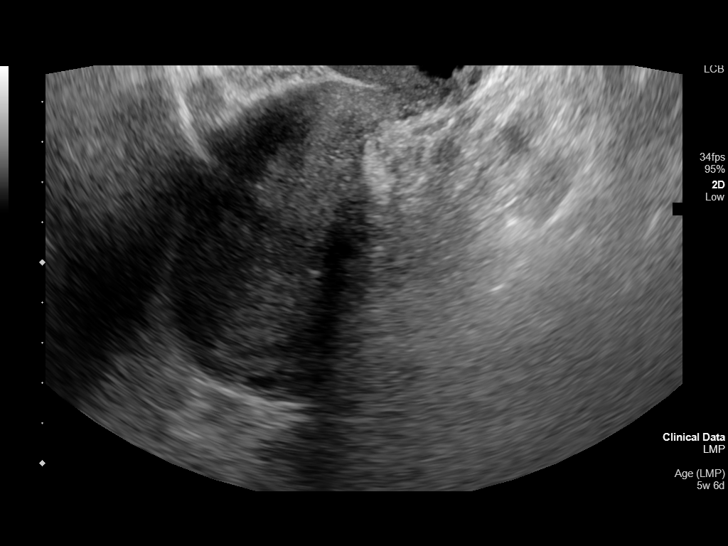
[im 50/68]
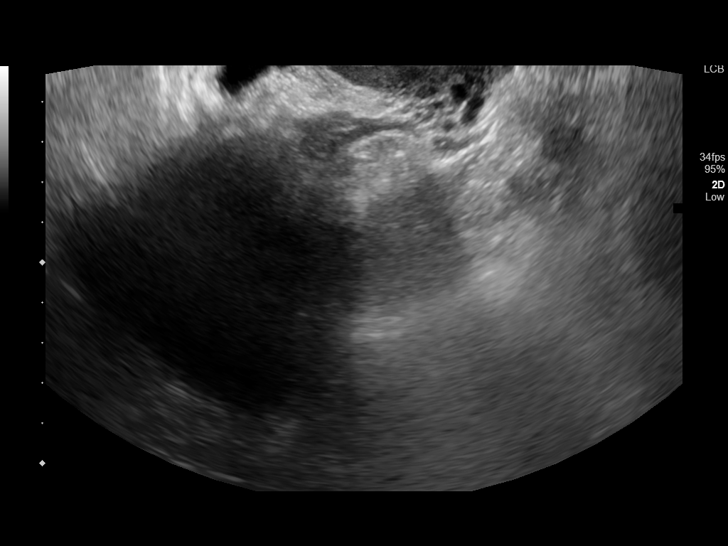
[im 55/68]
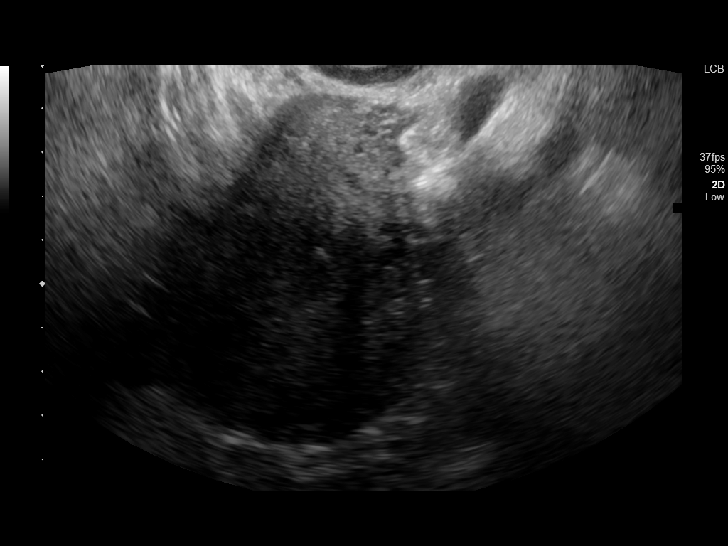
[im 60/68]
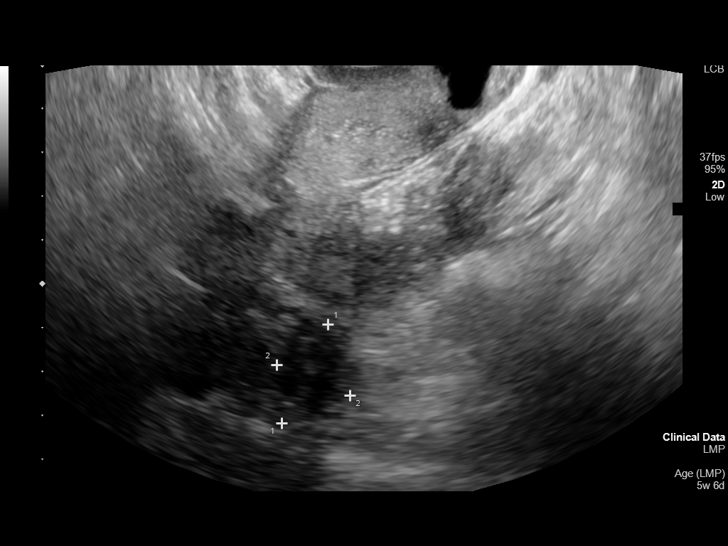
[im 65/68]
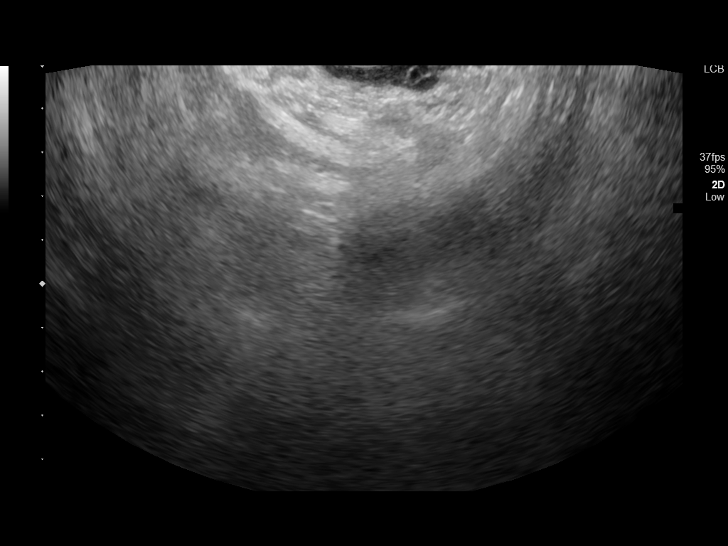

[13 of 28 positions shown; findings below may reference images not displayed]

FINDINGS: Intrauterine gestational sac: Present

Yolk sac:  None.

Embryo:  None.

Cardiac Activity: None.

Heart Rate: N/A

MSD: 5.7 mm   5 w   2  d

Subchorionic hemorrhage:  None visualized.

Maternal uterus/adnexae: Multiple hypoechoic lesions in the uterus,
largest of which is in the right-side of the uterine body measuring
2.3 x 1.9 x 1.9 cm, most compatible with multiple fibroids. Ovaries
are normal in appearance. No free fluid within the cul-de-sac.
IMPRESSION: 1. Probable early intrauterine gestational sac, but no yolk sac,
fetal pole, or cardiac activity yet visualized. Recommend follow-up
quantitative B-HCG levels and follow-up US in 14 days to assess
viability. This recommendation follows SRU consensus guidelines:
Diagnostic Criteria for Nonviable Pregnancy Early in the First
Trimester. N Engl J Med 6025; [DATE].
2. Fibroid uterus.

## 2023-06-23 ENCOUNTER — Encounter: Payer: Self-pay | Admitting: Nurse Practitioner

## 2023-06-23 ENCOUNTER — Ambulatory Visit: Admitting: Nurse Practitioner

## 2023-06-23 VITALS — BP 142/98 | HR 96 | Temp 98.7°F | Resp 16 | Ht 60.0 in | Wt 159.6 lb

## 2023-06-23 DIAGNOSIS — L659 Nonscarring hair loss, unspecified: Secondary | ICD-10-CM | POA: Diagnosis not present

## 2023-06-23 DIAGNOSIS — R102 Pelvic and perineal pain unspecified side: Secondary | ICD-10-CM

## 2023-06-23 DIAGNOSIS — E782 Mixed hyperlipidemia: Secondary | ICD-10-CM

## 2023-06-23 DIAGNOSIS — Z1211 Encounter for screening for malignant neoplasm of colon: Secondary | ICD-10-CM

## 2023-06-23 DIAGNOSIS — E538 Deficiency of other specified B group vitamins: Secondary | ICD-10-CM | POA: Diagnosis not present

## 2023-06-23 DIAGNOSIS — Z1212 Encounter for screening for malignant neoplasm of rectum: Secondary | ICD-10-CM

## 2023-06-23 DIAGNOSIS — E559 Vitamin D deficiency, unspecified: Secondary | ICD-10-CM

## 2023-06-23 DIAGNOSIS — Z1231 Encounter for screening mammogram for malignant neoplasm of breast: Secondary | ICD-10-CM

## 2023-06-23 LAB — POCT URINALYSIS DIPSTICK
Bilirubin, UA: NEGATIVE
Blood, UA: NEGATIVE
Glucose, UA: NEGATIVE
Ketones, UA: NEGATIVE
Leukocytes, UA: NEGATIVE
Nitrite, UA: NEGATIVE
Protein, UA: NEGATIVE
Spec Grav, UA: 1.01 (ref 1.010–1.025)
Urobilinogen, UA: 0.2 U/dL
pH, UA: 7.5 (ref 5.0–8.0)

## 2023-06-23 NOTE — Progress Notes (Addendum)
 [facility] [address] [place], [place] [address]  Internal MEDICINE  Office Visit Note  Patient Name: Connie Smith  [id number]  [id number]  Date of Service: 06/23/2023   Complaints/HPI Pt is here for establishment of PCP. Chief Complaint  Patient presents with   New Patient (Initial Visit)    Pain lower back and side     HPI Connie Smith presents for a new patient visit to establish care.  Well-appearing 48 y.o. female with no significant medical conditions. Not current on any prescription medications. Work: Runner, broadcasting/film/video for kindergarten  Home: live at home with husband  Diet: work in progress  Exercise: not regularly  Tobacco use: none  Alcohol use: none  Illicit drug use: none  Routine CRC screening: opt for cologuard  Routine mammogram:  Pap smear: due in June this year  Labs: due for routine labs  New or worsening pain: pain in the lower left abdomen/pelvic area, pain has been present for 6 months, hurts more when laying down. Has had fibroids in the past, UA was negative.    Current Medication: No outpatient encounter medications on file as of 06/23/2023.   No facility-administered encounter medications on file as of 06/23/2023.    Surgical History: Past Surgical History:  Procedure Laterality Date   CESAREAN SECTION N/A 11/15/2014   Procedure: CESAREAN SECTION;  Surgeon: Nadara Mustard, MD;  Location: [facility] ORS;  Service: Obstetrics;  Laterality: N/A;   LAPAROSCOPIC TUBAL LIGATION Bilateral 01/20/2015   Procedure: LAPAROSCOPIC TUBAL LIGATION;  Surgeon: Nadara Mustard, MD;  Location: [facility] ORS;  Service: Gynecology;  Laterality: Bilateral;  with hulka clips   LEEP     MYOMECTOMY      Medical History: Past Medical History:  Diagnosis Date   History of chicken pox    Medical history non-contributory    Thyroid disease     Family History: Family History  Problem Relation Age of Onset   Hypertension Mother    Hypertension Father    Arthritis Maternal  Grandmother    Diabetes Maternal Grandmother    Hypertension Maternal Grandmother    Breast cancer Maternal Grandmother    Arthritis Maternal Grandfather    Diabetes Maternal Grandfather    Hypertension Maternal Grandfather    Arthritis Paternal Grandmother    Diabetes Paternal Grandmother    Hypertension Paternal Grandmother    Breast cancer Other    Breast cancer Maternal Aunt     Social History   Socioeconomic History   Marital status: Married    Spouse name: Not on file   Number of children: Not on file   Years of education: Not on file   Highest education level: Not on file  Occupational History   Not on file  Tobacco Use   Smoking status: Never   Smokeless tobacco: Never  Vaping Use   Vaping status: Never Used  Substance and Sexual Activity   Alcohol use: No   Drug use: No   Sexual activity: Yes    Birth control/protection: Surgical  Other Topics Concern   Not on file  Social History Narrative   Not on file   Social Drivers of Health   Financial Resource Strain: Not on file  Food Insecurity: Not on file  Transportation Needs: Not on file  Physical Activity: Not on file  Stress: Not on file  Social Connections: Not on file  Intimate Partner Violence: Not on file     Review of Systems  Constitutional:  Positive for fatigue.  HENT:         Hair loss  Respiratory: Negative.  Negative for cough, chest tightness, shortness of breath and wheezing.   Cardiovascular: Negative.  Negative for chest pain and palpitations.  Gastrointestinal:  Positive for abdominal pain (lower left quadrant). Negative for diarrhea, nausea and vomiting.  Genitourinary:  Positive for pelvic pain. Negative for dysuria, flank pain, hematuria, menstrual problem, vaginal bleeding, vaginal discharge and vaginal pain.       History of fibroids  Musculoskeletal: Negative.   Neurological: Negative.   Psychiatric/Behavioral: Negative.      Vital Signs: BP (!) 142/98   Pulse 96   Temp  98.7 F (37.1 C)   Resp 16   Ht 5' (1.524 m)   Wt 159 lb 9.6 oz (72.4 kg)   LMP 06/16/2023 (Exact Date)   SpO2 99%   Breastfeeding No   BMI 31.17 kg/m    Physical Exam Vitals reviewed.  Constitutional:      General: She is not in acute distress.    Appearance: Normal appearance. She is obese. She is not ill-appearing.  HENT:     Head: Normocephalic and atraumatic.  Eyes:     Pupils: Pupils are equal, round, and reactive to light.  Cardiovascular:     Rate and Rhythm: Normal rate and regular rhythm.  Pulmonary:     Effort: Pulmonary effort is normal. No respiratory distress.  Abdominal:     Tenderness: There is abdominal tenderness (pelvic) in the suprapubic area and left lower quadrant.  Neurological:     Mental Status: She is alert and oriented to person, place, and time.  Psychiatric:        Mood and Affect: Mood normal.        Behavior: Behavior normal.       Assessment/Plan: 1. Pelvic pain (Primary) Pelvic ultrasound ordered. Urinalysis was negative for UTI or other abnormality. Additional labs ordered for further evaluation.  - POCT urinalysis dipstick - CBC with Differential/Platelet - TSH + free T4 - FSH/LH - Estradiol - US PELVIC COMPLETE W TRANSVAGINAL AND TORSION R/O; Future  2. Mixed hyperlipidemia Routine labs ordered  - CBC with Differential/Platelet - CMP14+EGFR - Lipid Profile - TSH + free T4 - Vitamin D (25 hydroxy) - B12 and Folate Panel - FSH/LH - Estradiol  3. Hair loss Routine labs ordered  - CBC with Differential/Platelet - CMP14+EGFR - TSH + free T4 - Vitamin D (25 hydroxy) - B12 and Folate Panel  4. B12 deficiency Routine labs ordered  - CBC with Differential/Platelet - CMP14+EGFR - Lipid Profile - TSH + free T4 - Vitamin D (25 hydroxy) - B12 and Folate Panel - FSH/LH - Estradiol  5. Vitamin D deficiency Routine labs ordered  - CBC with Differential/Platelet - CMP14+EGFR - Lipid Profile - TSH + free T4 - Vitamin  D (25 hydroxy) - B12 and Folate Panel - FSH/LH - Estradiol  6. Encounter for screening mammogram for malignant neoplasm of breast Routine mammogram ordered  - MM 3D SCREENING MAMMOGRAM BILATERAL BREAST; Future  7. Screening for colorectal cancer Cologuard test ordered  - Cologuard    General Counseling: Connie Smith verbalizes understanding of the findings of todays visit and agrees with plan of treatment. I have discussed any further diagnostic evaluation that may be needed or ordered today. We also reviewed her medications today. she has been encouraged to call the office with any questions or concerns that should arise related to todays visit.    Orders Placed This Encounter  Procedures   MM 3D SCREENING MAMMOGRAM BILATERAL BREAST   US PELVIC COMPLETE WITH TRANSVAGINAL   Cologuard   CBC with Differential/Platelet   CMP14+EGFR   Lipid Profile   TSH + free T4   Vitamin D (25 hydroxy)   B12 and Folate Panel   FSH/LH   Estradiol   POCT urinalysis dipstick    No orders of the defined types were placed in this encounter.   Return in about 4 weeks (around 07/21/2023) for F/U, Review labs/test, Rhylei Mcquaig PCP -- have blood drawn prior to next visi and pelvic US ordered. .  Time spent:30 Minutes Time spent with patient included reviewing progress notes, labs, imaging studies, and discussing plan for follow up.   [place] Controlled Substance Database was reviewed by me for overdose risk score (ORS)   This patient was seen by Sallyanne Kuster, FNP-C in collaboration with Dr. Beverely Risen as a part of collaborative care agreement.   Armine Rizzolo R. Tedd Sias, MSN, FNP-C Internal Medicine

## 2023-06-23 NOTE — Addendum Note (Signed)
 Addended by: Sallyanne Kuster on: 06/23/2023 03:42 PM   Modules accepted: Orders

## 2023-07-01 ENCOUNTER — Other Ambulatory Visit

## 2023-07-02 ENCOUNTER — Encounter

## 2023-07-10 ENCOUNTER — Ambulatory Visit
Admission: RE | Admit: 2023-07-10 | Discharge: 2023-07-10 | Disposition: A | Discharge status: Home / Self Care | Payer: Self-pay | Source: Ambulatory Visit | Attending: Nurse Practitioner | Admitting: Nurse Practitioner

## 2023-07-10 ENCOUNTER — Ambulatory Visit
Admission: RE | Admit: 2023-07-10 | Discharge: 2023-07-10 | Disposition: A | Discharge status: Home / Self Care | Source: Ambulatory Visit | Attending: Nurse Practitioner | Admitting: Nurse Practitioner

## 2023-07-10 DIAGNOSIS — R102 Pelvic and perineal pain: Secondary | ICD-10-CM

## 2023-07-10 DIAGNOSIS — Z1231 Encounter for screening mammogram for malignant neoplasm of breast: Secondary | ICD-10-CM | POA: Diagnosis present

## 2023-07-14 ENCOUNTER — Other Ambulatory Visit: Payer: Self-pay | Admitting: Nurse Practitioner

## 2023-07-14 DIAGNOSIS — R928 Other abnormal and inconclusive findings on diagnostic imaging of breast: Secondary | ICD-10-CM

## 2023-07-15 LAB — CBC WITH DIFFERENTIAL/PLATELET
Basophils Absolute: 0 10*3/uL (ref 0.0–0.2)
Basos: 1 %
EOS (ABSOLUTE): 0.1 10*3/uL (ref 0.0–0.4)
Eos: 3 %
Hematocrit: 37.5 % (ref 34.0–46.6)
Hemoglobin: 12.4 g/dL (ref 11.1–15.9)
Immature Grans (Abs): 0 10*3/uL (ref 0.0–0.1)
Immature Granulocytes: 0 %
Lymphocytes Absolute: 1.4 10*3/uL (ref 0.7–3.1)
Lymphs: 33 %
MCH: 31 pg (ref 26.6–33.0)
MCHC: 33.1 g/dL (ref 31.5–35.7)
MCV: 94 fL (ref 79–97)
Monocytes Absolute: 0.4 10*3/uL (ref 0.1–0.9)
Monocytes: 9 %
Neutrophils Absolute: 2.4 10*3/uL (ref 1.4–7.0)
Neutrophils: 54 %
Platelets: 283 10*3/uL (ref 150–450)
RBC: 4 x10E6/uL (ref 3.77–5.28)
RDW: 13.1 % (ref 11.7–15.4)
WBC: 4.4 10*3/uL (ref 3.4–10.8)

## 2023-07-15 LAB — CMP14+EGFR
ALT: 16 IU/L (ref 0–32)
AST: 17 IU/L (ref 0–40)
Albumin: 4.4 g/dL (ref 3.9–4.9)
Alkaline Phosphatase: 55 IU/L (ref 44–121)
BUN/Creatinine Ratio: 14 (ref 9–23)
BUN: 10 mg/dL (ref 6–24)
Bilirubin Total: 0.3 mg/dL (ref 0.0–1.2)
CO2: 22 mmol/L (ref 20–29)
Calcium: 9 mg/dL (ref 8.7–10.2)
Chloride: 106 mmol/L (ref 96–106)
Creatinine, Ser: 0.69 mg/dL (ref 0.57–1.00)
Globulin, Total: 2.3 g/dL (ref 1.5–4.5)
Glucose: 78 mg/dL (ref 70–99)
Potassium: 3.8 mmol/L (ref 3.5–5.2)
Sodium: 141 mmol/L (ref 134–144)
Total Protein: 6.7 g/dL (ref 6.0–8.5)
eGFR: 108 mL/min/{1.73_m2} (ref >59)

## 2023-07-15 LAB — TSH+FREE T4
Free T4: 1.19 ng/dL (ref 0.82–1.77)
TSH: 0.341 u[IU]/mL — ABNORMAL LOW (ref 0.450–4.500)

## 2023-07-15 LAB — B12 AND FOLATE PANEL
Folate: 8 ng/mL (ref >3.0)
Vitamin B-12: 249 pg/mL (ref 232–1245)

## 2023-07-15 LAB — LIPID PANEL
Chol/HDL Ratio: 3.4 ratio (ref 0.0–4.4)
Cholesterol, Total: 171 mg/dL (ref 100–199)
HDL: 51 mg/dL (ref >39)
LDL Chol Calc (NIH): 110 mg/dL — ABNORMAL HIGH (ref 0–99)
Triglycerides: 52 mg/dL (ref 0–149)
VLDL Cholesterol Cal: 10 mg/dL (ref 5–40)

## 2023-07-15 LAB — FSH/LH
FSH: 4.6 m[IU]/mL
LH: 5.9 m[IU]/mL

## 2023-07-15 LAB — ESTRADIOL: Estradiol: 139 pg/mL

## 2023-07-15 LAB — VITAMIN D 25 HYDROXY (VIT D DEFICIENCY, FRACTURES): Vit D, 25-Hydroxy: 10.9 ng/mL — ABNORMAL LOW (ref 30.0–100.0)

## 2023-07-16 ENCOUNTER — Ambulatory Visit
Admission: RE | Admit: 2023-07-16 | Discharge: 2023-07-16 | Disposition: A | Discharge status: Home / Self Care | Source: Ambulatory Visit | Attending: Nurse Practitioner | Admitting: Nurse Practitioner

## 2023-07-16 DIAGNOSIS — R928 Other abnormal and inconclusive findings on diagnostic imaging of breast: Secondary | ICD-10-CM | POA: Diagnosis present

## 2023-07-21 LAB — COLOGUARD: COLOGUARD: NEGATIVE

## 2023-07-22 ENCOUNTER — Ambulatory Visit: Admitting: Nurse Practitioner

## 2023-07-22 ENCOUNTER — Encounter: Payer: Self-pay | Admitting: Nurse Practitioner

## 2023-07-22 VITALS — BP 138/88 | HR 82 | Temp 98.2°F | Resp 16 | Ht 60.0 in | Wt 158.6 lb

## 2023-07-22 DIAGNOSIS — E559 Vitamin D deficiency, unspecified: Secondary | ICD-10-CM

## 2023-07-22 DIAGNOSIS — E7800 Pure hypercholesterolemia, unspecified: Secondary | ICD-10-CM | POA: Diagnosis not present

## 2023-07-22 DIAGNOSIS — D259 Leiomyoma of uterus, unspecified: Secondary | ICD-10-CM | POA: Diagnosis not present

## 2023-07-22 DIAGNOSIS — E538 Deficiency of other specified B group vitamins: Secondary | ICD-10-CM | POA: Diagnosis not present

## 2023-07-22 MED ORDER — CYANOCOBALAMIN 1000 MCG/ML IJ SOLN
1000.0000 ug | Freq: Once | INTRAMUSCULAR | Status: AC
  Administered 2023-07-22: 1000 ug via INTRAMUSCULAR

## 2023-07-22 MED ORDER — VITAMIN D (ERGOCALCIFEROL) 1.25 MG (50000 UNIT) PO CAPS: 50000.0000 [IU] | ORAL_CAPSULE | ORAL | 1 refills | Status: DC

## 2023-07-22 NOTE — Progress Notes (Signed)
 [facility] [address] [place], [place] [address]  Internal MEDICINE  Office Visit Note  Patient Name: Connie Smith  [id number]  [id number]  Date of Service: 07/22/2023  Chief Complaint  Patient presents with   Follow-up    Review u/s and labs    HPI Connie Smith presents for a follow-up visit for lab and imaging results  Pelvic ultrasound -- has multiple uterine fibroids that may be causing her pelvic and abdominal pain. FSH, LH and estradiol  are normal.  Low b12 249 -- start monthly B12 injections  Low vitamin d  -- very low at 10.9 Low TSH -- has a history of low TSH but does not have any symptoms  Slightly elevated LDL 110 and the rest of the cholesterol panel is normal  Cologuard test was negative.  CBC and CMP are normal.    Current Medication: Outpatient Encounter Medications as of 07/22/2023  Medication Sig   Vitamin D , Ergocalciferol , (DRISDOL ) 1.25 MG (50000 UNIT) CAPS capsule Take 1 capsule (50,000 Units total) by mouth every 7 (seven) days.   Facility-Administered Encounter Medications as of 07/22/2023  Medication   cyanocobalamin  (VITAMIN B12) injection 1,000 mcg    Surgical History: Past Surgical History:  Procedure Laterality Date   CESAREAN SECTION N/A 11/15/2014   Procedure: CESAREAN SECTION;  Surgeon: Alben Alma, MD;  Location: [facility] ORS;  Service: Obstetrics;  Laterality: N/A;   LAPAROSCOPIC TUBAL LIGATION Bilateral 01/20/2015   Procedure: LAPAROSCOPIC TUBAL LIGATION;  Surgeon: Alben Alma, MD;  Location: [facility] ORS;  Service: Gynecology;  Laterality: Bilateral;  with hulka clips   LEEP     MYOMECTOMY      Medical History: Past Medical History:  Diagnosis Date   History of chicken pox    Medical history non-contributory    Thyroid  disease     Family History: Family History  Problem Relation Age of Onset   Hypertension Mother    Hypertension Father    Arthritis Maternal Grandmother    Diabetes Maternal Grandmother    Hypertension  Maternal Grandmother    Breast cancer Maternal Grandmother    Arthritis Maternal Grandfather    Diabetes Maternal Grandfather    Hypertension Maternal Grandfather    Arthritis Paternal Grandmother    Diabetes Paternal Grandmother    Hypertension Paternal Grandmother    Breast cancer Other    Breast cancer Maternal Aunt     Social History   Socioeconomic History   Marital status: Married    Spouse name: Not on file   Number of children: Not on file   Years of education: Not on file   Highest education level: Not on file  Occupational History   Not on file  Tobacco Use   Smoking status: Never   Smokeless tobacco: Never  Vaping Use   Vaping status: Never Used  Substance and Sexual Activity   Alcohol use: No   Drug use: No   Sexual activity: Yes    Birth control/protection: Surgical  Other Topics Concern   Not on file  Social History Narrative   Not on file   Social Drivers of Health   Financial Resource Strain: Not on file  Food Insecurity: Not on file  Transportation Needs: Not on file  Physical Activity: Not on file  Stress: Not on file  Social Connections: Not on file  Intimate Partner Violence: Not on file      Review of Systems  Constitutional:  Positive for fatigue.  HENT:  Hair loss  Respiratory: Negative.  Negative for cough, chest tightness, shortness of breath and wheezing.   Cardiovascular: Negative.  Negative for chest pain and palpitations.  Gastrointestinal:  Positive for abdominal pain (lower left quadrant). Negative for diarrhea, nausea and vomiting.  Genitourinary:  Positive for pelvic pain. Negative for dysuria, flank pain, hematuria, menstrual problem, vaginal bleeding, vaginal discharge and vaginal pain.       History of fibroids  Musculoskeletal: Negative.   Neurological: Negative.   Psychiatric/Behavioral: Negative.      Vital Signs: BP 138/88   Pulse 82   Temp 98.2 F (36.8 C)   Resp 16   Ht 5' (1.524 m)   Wt 158 lb 9.6  oz (71.9 kg)   LMP 07/06/2023 (Exact Date)   SpO2 97%   BMI 30.97 kg/m    Physical Exam Vitals reviewed.  Constitutional:      General: She is not in acute distress.    Appearance: Normal appearance. She is obese. She is not ill-appearing.  HENT:     Head: Normocephalic and atraumatic.  Eyes:     Pupils: Pupils are equal, round, and reactive to light.  Cardiovascular:     Rate and Rhythm: Normal rate and regular rhythm.  Pulmonary:     Effort: Pulmonary effort is normal. No respiratory distress.  Abdominal:     Tenderness: There is abdominal tenderness (pelvic) in the suprapubic area and left lower quadrant.  Neurological:     Mental Status: She is alert and oriented to person, place, and time.  Psychiatric:        Mood and Affect: Mood normal.        Behavior: Behavior normal.        Assessment/Plan: 1. Uterine leiomyoma, unspecified location (Primary) Referred to OBGYN for further evaluation and treatment.  - Ambulatory referral to Obstetrics / Gynecology  2. Elevated LDL cholesterol level Limit red meat intake, increase lean protein in diet, add some exercise as tolerated, start with just walking for 5-10 minutes per day.   3. B12 deficiency B12 injection administered in office today, recommend monthly injections for 5-6 months - cyanocobalamin  (VITAMIN B12) injection 1,000 mcg  4. Vitamin D  deficiency Start weekly vitamin D  supplement as prescribed.  - Vitamin D , Ergocalciferol , (DRISDOL ) 1.25 MG (50000 UNIT) CAPS capsule; Take 1 capsule (50,000 Units total) by mouth every 7 (seven) days.  Dispense: 12 capsule; Refill: 1   General Counseling: [name] verbalizes understanding of the findings of todays visit and agrees with plan of treatment. I have discussed any further diagnostic evaluation that may be needed or ordered today. We also reviewed her medications today. she has been encouraged to call the office with any questions or concerns that should arise related  to todays visit.    Orders Placed This Encounter  Procedures   Ambulatory referral to Obstetrics / Gynecology    Meds ordered this encounter  Medications   cyanocobalamin  (VITAMIN B12) injection 1,000 mcg   Vitamin D , Ergocalciferol , (DRISDOL ) 1.25 MG (50000 UNIT) CAPS capsule    Sig: Take 1 capsule (50,000 Units total) by mouth every 7 (seven) days.    Dispense:  12 capsule    Refill:  1    Return for CPE, Saber Dickerman PCP in july or august .   Total time spent:30 Minutes Time spent includes review of chart, medications, test results, and follow up plan with the patient.   [place] Controlled Substance Database was reviewed by me.  This patient was seen by  Laurence Pons, FNP-C in collaboration with Dr. Verneta Gone as a part of collaborative care agreement.   Rollan Roger R. Bobbi Burow, MSN, FNP-C Internal medicine

## 2023-08-19 ENCOUNTER — Telehealth: Payer: Self-pay | Admitting: Internal Medicine

## 2023-08-20 ENCOUNTER — Encounter: Payer: Self-pay | Admitting: Obstetrics and Gynecology

## 2023-08-20 ENCOUNTER — Ambulatory Visit: Admitting: Obstetrics and Gynecology

## 2023-08-20 VITALS — BP 135/89 | HR 76 | Ht 60.0 in | Wt 162.1 lb

## 2023-08-20 DIAGNOSIS — R102 Pelvic and perineal pain: Secondary | ICD-10-CM

## 2023-08-20 DIAGNOSIS — D259 Leiomyoma of uterus, unspecified: Secondary | ICD-10-CM

## 2023-08-20 DIAGNOSIS — Z7689 Persons encountering health services in other specified circumstances: Secondary | ICD-10-CM

## 2023-08-20 DIAGNOSIS — D219 Benign neoplasm of connective and other soft tissue, unspecified: Secondary | ICD-10-CM

## 2023-08-20 NOTE — Progress Notes (Signed)
 HPI:      [name] is a 48 y.o. [phone] who LMP was Patient's last menstrual period was 08/04/2023.  Subjective:   She presents today because she has begun having pelvic pain-mostly left-sided.  She has a history of uterine fibroids and underwent previous resection of multiple myomectomies in 2011.  Her menstrual periods are regular and not a problem with pain or heavy bleeding but over the last several months she has begun having more and more left-sided pain.  She says it is worse with exertion and at the end of the day. Of significant note, patient has had multiple abdominal surgeries including cesarean deliveries, myomectomy and tubal ligation.    Hx: The following portions of the patient's history were reviewed and updated as appropriate:             She  has a past medical history of History of chicken pox, Medical history non-contributory, and Thyroid  disease. She does not have any pertinent problems on file. She  has a past surgical history that includes Myomectomy; Cesarean section (N/A, 11/15/2014); LEEP; and Laparoscopic tubal ligation (Bilateral, 01/20/2015). Her family history includes Arthritis in her maternal grandfather, maternal grandmother, and paternal grandmother; Breast cancer in her maternal aunt, maternal grandmother, and another family member; Diabetes in her maternal grandfather, maternal grandmother, and paternal grandmother; Hypertension in her father, maternal grandfather, maternal grandmother, mother, and paternal grandmother. She  reports that she has never smoked. She has never used smokeless tobacco. She reports that she does not drink alcohol and does not use drugs. She has a current medication list which includes the following prescription(s): vitamin d  (ergocalciferol ). She has no known allergies.       Review of Systems:  Review of Systems  Constitutional: Denied constitutional symptoms, night sweats, recent illness, fatigue, fever, insomnia and weight  loss.  Eyes: Denied eye symptoms, eye pain, photophobia, vision change and visual disturbance.  Ears/Nose/Throat/Neck: Denied ear, nose, throat or neck symptoms, hearing loss, nasal discharge, sinus congestion and sore throat.  Cardiovascular: Denied cardiovascular symptoms, arrhythmia, chest pain/pressure, edema, exercise intolerance, orthopnea and palpitations.  Respiratory: Denied pulmonary symptoms, asthma, pleuritic pain, productive sputum, cough, dyspnea and wheezing.  Gastrointestinal: Denied, gastro-esophageal reflux, melena, nausea and vomiting.  Genitourinary: See HPI for additional information.  Musculoskeletal: Denied musculoskeletal symptoms, stiffness, swelling, muscle weakness and myalgia.  Dermatologic: Denied dermatology symptoms, rash and scar.  Neurologic: Denied neurology symptoms, dizziness, headache, neck pain and syncope.  Psychiatric: Denied psychiatric symptoms, anxiety and depression.  Endocrine: Denied endocrine symptoms including hot flashes and night sweats.   Meds:   Current Outpatient Medications on File Prior to Visit  Medication Sig Dispense Refill   Vitamin D , Ergocalciferol , (DRISDOL ) 1.25 MG (50000 UNIT) CAPS capsule Take 1 capsule (50,000 Units total) by mouth every 7 (seven) days. 12 capsule 1   No current facility-administered medications on file prior to visit.      Objective:     Vitals:   08/20/23 0807 08/20/23 0833  BP: (!) 143/103 135/89  Pulse: 76    Filed Weights   08/20/23 0807  Weight: 162 lb 1.6 oz (73.5 kg)              Abdominal exam:  No evidence of hernia.  Pain with deep palpation in the left lower quadrant.  This is the same pain she has complained about above.  It is not localized to the abdominal wall.  Midline abdominal scar with keloid formation.   See ultrasound report  showing uterine fibroids          Assessment:    [id number] There are no active problems to display for this patient.    1. Establishing care  with new doctor, encounter for   2. Fibroids   3. Pelvic pain in female     We have discussed her pelvic pain.  I believe it is most likely related to her enlarging fibroids noted on ultrasound.  She has an increased risk for abdominal surgery because of multiple prior surgeries and scar formation.   Plan:            1.  Discussed the option of hysterectomy.  Risks and benefits reviewed.  We have also discussed UFE.  Literature given.  Risks and benefits of UFE discussed as well.  All questions answered.  Patient will inform us  when she has reached a conclusion regarding her uterine fibroids and pelvic pain.  Orders No orders of the defined types were placed in this encounter.   No orders of the defined types were placed in this encounter.     F/U  Return for Pt to contact us  if symptoms worsen.  Delice Felt, M.D. 08/20/2023 9:11 AM

## 2023-08-20 NOTE — Progress Notes (Signed)
 Patient presents today to discuss fibroids. She states in 2011 having a myomectomy but within the last year began experiencing symptoms again. Reports a constant pain over the last 3-4 months, before this is would come and go. She states she is thinking about a hysterectomy at this time.

## 2023-08-26 ENCOUNTER — Telehealth: Payer: Self-pay

## 2023-08-26 DIAGNOSIS — R102 Pelvic and perineal pain: Secondary | ICD-10-CM

## 2023-08-26 DIAGNOSIS — D219 Benign neoplasm of connective and other soft tissue, unspecified: Secondary | ICD-10-CM

## 2023-08-26 NOTE — Telephone Encounter (Signed)
 TRIAGE VOICEMAIL: Patient inquiring if Dr. Luster Salters can send a referral for Uterine Fibroid Embolization to the clinic in the [place] area.

## 2023-08-27 ENCOUNTER — Ambulatory Visit: Admitting: Nurse Practitioner

## 2023-08-31 ENCOUNTER — Encounter: Payer: Self-pay | Admitting: Nurse Practitioner

## 2023-08-31 DIAGNOSIS — D259 Leiomyoma of uterus, unspecified: Secondary | ICD-10-CM | POA: Insufficient documentation

## 2023-08-31 DIAGNOSIS — E538 Deficiency of other specified B group vitamins: Secondary | ICD-10-CM | POA: Insufficient documentation

## 2023-08-31 DIAGNOSIS — E7800 Pure hypercholesterolemia, unspecified: Secondary | ICD-10-CM | POA: Insufficient documentation

## 2023-08-31 DIAGNOSIS — E559 Vitamin D deficiency, unspecified: Secondary | ICD-10-CM | POA: Insufficient documentation

## 2023-11-25 ENCOUNTER — Encounter: Admitting: Nurse Practitioner

## 2023-12-10 ENCOUNTER — Other Ambulatory Visit: Payer: Self-pay | Admitting: Nurse Practitioner

## 2023-12-10 DIAGNOSIS — R921 Mammographic calcification found on diagnostic imaging of breast: Secondary | ICD-10-CM

## 2023-12-10 DIAGNOSIS — R928 Other abnormal and inconclusive findings on diagnostic imaging of breast: Secondary | ICD-10-CM

## 2023-12-11 ENCOUNTER — Encounter: Admitting: Nurse Practitioner

## 2023-12-30 ENCOUNTER — Encounter: Payer: Self-pay | Admitting: Nurse Practitioner

## 2023-12-30 ENCOUNTER — Ambulatory Visit (INDEPENDENT_AMBULATORY_CARE_PROVIDER_SITE_OTHER): Admitting: Nurse Practitioner

## 2023-12-30 VITALS — BP 126/82 | HR 91 | Temp 98.1°F | Resp 16 | Ht 60.0 in | Wt 164.2 lb

## 2023-12-30 DIAGNOSIS — Z0001 Encounter for general adult medical examination with abnormal findings: Secondary | ICD-10-CM | POA: Diagnosis not present

## 2023-12-30 DIAGNOSIS — Z23 Encounter for immunization: Secondary | ICD-10-CM

## 2023-12-30 DIAGNOSIS — E7800 Pure hypercholesterolemia, unspecified: Secondary | ICD-10-CM | POA: Diagnosis not present

## 2023-12-30 DIAGNOSIS — D259 Leiomyoma of uterus, unspecified: Secondary | ICD-10-CM

## 2023-12-30 DIAGNOSIS — E538 Deficiency of other specified B group vitamins: Secondary | ICD-10-CM

## 2023-12-30 DIAGNOSIS — E559 Vitamin D deficiency, unspecified: Secondary | ICD-10-CM | POA: Diagnosis not present

## 2023-12-30 MED ORDER — VITAMIN D (ERGOCALCIFEROL) 1.25 MG (50000 UNIT) PO CAPS: 50000.0000 [IU] | ORAL_CAPSULE | ORAL | 1 refills | Status: AC

## 2023-12-30 NOTE — Progress Notes (Signed)
 [facility] [address] [place], [place] [address]  Internal MEDICINE  Office Visit Note  Patient Name: Connie Smith  [id number]  [id number]  Date of Service: 12/30/2023  Chief Complaint  Patient presents with   Annual Exam    HPI Connie Smith presents for an annual well visit and physical exam.  Well-appearing 48 y.o. female with uterine fibroids, vitamin D  deficiency, B12 deficiency, and high cholesterol.  Routine CRC screening: cologuard negative in April, due in 3 years.  Routine mammogram: done this year and was abnormal and a 6 month follow up was recommended which hould be around October.  DEXA scan: not due yet.  Pap smear: last done in 2020 Labs: up to date  New or worsening pain: none  Other concerns: none     Current Medication: Outpatient Encounter Medications as of 12/30/2023  Medication Sig   Vitamin D , Ergocalciferol , (DRISDOL ) 1.25 MG (50000 UNIT) CAPS capsule Take 1 capsule (50,000 Units total) by mouth every 7 (seven) days.   [DISCONTINUED] Vitamin D , Ergocalciferol , (DRISDOL ) 1.25 MG (50000 UNIT) CAPS capsule Take 1 capsule (50,000 Units total) by mouth every 7 (seven) days.   No facility-administered encounter medications on file as of 12/30/2023.    Surgical History: Past Surgical History:  Procedure Laterality Date   CESAREAN SECTION N/A 11/15/2014   Procedure: CESAREAN SECTION;  Surgeon: Lamar SHAUNNA Lesches, MD;  Location: [facility] ORS;  Service: Obstetrics;  Laterality: N/A;   LAPAROSCOPIC TUBAL LIGATION Bilateral 01/20/2015   Procedure: LAPAROSCOPIC TUBAL LIGATION;  Surgeon: Lamar SHAUNNA Lesches, MD;  Location: [facility] ORS;  Service: Gynecology;  Laterality: Bilateral;  with hulka clips   LEEP     MYOMECTOMY      Medical History: Past Medical History:  Diagnosis Date   History of chicken pox    Medical history non-contributory    Thyroid  disease     Family History: Family History  Problem Relation Age of Onset   Hypertension Mother    Hypertension  Father    Arthritis Maternal Grandmother    Diabetes Maternal Grandmother    Hypertension Maternal Grandmother    Breast cancer Maternal Grandmother    Arthritis Maternal Grandfather    Diabetes Maternal Grandfather    Hypertension Maternal Grandfather    Arthritis Paternal Grandmother    Diabetes Paternal Grandmother    Hypertension Paternal Grandmother    Breast cancer Other    Breast cancer Maternal Aunt     Social History   Socioeconomic History   Marital status: Married    Spouse name: Not on file   Number of children: Not on file   Years of education: Not on file   Highest education level: Not on file  Occupational History   Not on file  Tobacco Use   Smoking status: Never   Smokeless tobacco: Never  Vaping Use   Vaping status: Never Used  Substance and Sexual Activity   Alcohol use: No   Drug use: No   Sexual activity: Yes    Birth control/protection: Surgical    Comment: BTL  Other Topics Concern   Not on file  Social History Narrative   Not on file   Social Drivers of Health   Financial Resource Strain: Not on file  Food Insecurity: Not on file  Transportation Needs: Not on file  Physical Activity: Not on file  Stress: Not on file  Social Connections: Not on file  Intimate Partner Violence: Not on file      Review of Systems  Constitutional:  Negative for activity change, appetite change, chills, fatigue, fever and unexpected weight change.  HENT: Negative.  Negative for congestion, ear pain, rhinorrhea, sore throat and trouble swallowing.   Eyes: Negative.   Respiratory: Negative.  Negative for cough, chest tightness, shortness of breath and wheezing.   Cardiovascular: Negative.  Negative for chest pain.  Gastrointestinal: Negative.  Negative for abdominal pain, blood in stool, constipation, diarrhea, nausea and vomiting.  Endocrine: Negative.   Genitourinary: Negative.  Negative for difficulty urinating, dysuria, frequency, hematuria and  urgency.  Musculoskeletal: Negative.  Negative for arthralgias, back pain, joint swelling, myalgias and neck pain.  Skin: Negative.  Negative for rash and wound.  Allergic/Immunologic: Negative.  Negative for immunocompromised state.  Neurological: Negative.  Negative for dizziness, seizures, numbness and headaches.  Hematological: Negative.   Psychiatric/Behavioral: Negative.  Negative for behavioral problems, self-injury and suicidal ideas. The patient is not nervous/anxious.     Vital Signs: BP 126/82   Pulse 91   Temp 98.1 F (36.7 C)   Resp 16   Ht 5' (1.524 m)   Wt 164 lb 3.2 oz (74.5 kg)   SpO2 98%   BMI 32.07 kg/m    Physical Exam Vitals reviewed.  Constitutional:      General: She is not in acute distress.    Appearance: Normal appearance. She is obese. She is not ill-appearing.  HENT:     Head: Normocephalic and atraumatic.     Right Ear: Tympanic membrane, ear canal and external ear normal.     Left Ear: Tympanic membrane, ear canal and external ear normal.     Nose: Nose normal. No congestion or rhinorrhea.     Mouth/Throat:     Mouth: Mucous membranes are moist.     Pharynx: Oropharynx is clear. No oropharyngeal exudate or posterior oropharyngeal erythema.  Eyes:     Extraocular Movements: Extraocular movements intact.     Conjunctiva/sclera: Conjunctivae normal.     Pupils: Pupils are equal, round, and reactive to light.  Cardiovascular:     Rate and Rhythm: Normal rate and regular rhythm.     Pulses: Normal pulses.     Heart sounds: Normal heart sounds. No murmur heard. Pulmonary:     Effort: Pulmonary effort is normal. No respiratory distress.     Breath sounds: Normal breath sounds.  Abdominal:     General: Bowel sounds are normal.     Palpations: Abdomen is soft.  Musculoskeletal:        General: Normal range of motion.     Cervical back: Normal range of motion and neck supple.  Skin:    General: Skin is warm and dry.     Capillary Refill:  Capillary refill takes less than 2 seconds.  Neurological:     Mental Status: She is alert and oriented to person, place, and time.     Cranial Nerves: No cranial nerve deficit.     Coordination: Coordination normal.     Gait: Gait normal.  Psychiatric:        Mood and Affect: Mood normal.        Behavior: Behavior normal.        Assessment/Plan: 1. Encounter for routine adult health examination with abnormal findings (Primary) Age-appropriate preventive screenings and vaccinations discussed, annual physical exam completed. Routine labs for health maintenance ordered to be repeated in 6 months. PHM updated.   - CBC with Differential/Platelet - CMP14+EGFR  2. Uterine leiomyoma, unspecified location Following up with OBGYN, pain  is improved per patient.  3. Elevated LDL cholesterol level Repet lab in 6 months  - CBC with Differential/Platelet - CMP14+EGFR - Lipid Profile  4. B12 deficiency Repeat labs in 6 months  - CBC with Differential/Platelet - CMP14+EGFR - B12 and Folate Panel  5. Vitamin D  deficiency Repeat lab in 6 months  - Vitamin D , Ergocalciferol , (DRISDOL ) 1.25 MG (50000 UNIT) CAPS capsule; Take 1 capsule (50,000 Units total) by mouth every 7 (seven) days.  Dispense: 12 capsule; Refill: 1 - Vitamin D  (25 hydroxy)  6. Needs flu shot Flu vaccine administered in the office today.  - Influenza, MDCK, trivalent, PF(Flucelvax egg-free)     General Counseling: Connie Smith verbalizes understanding of the findings of todays visit and agrees with plan of treatment. I have discussed any further diagnostic evaluation that may be needed or ordered today. We also reviewed her medications today. she has been encouraged to call the office with any questions or concerns that should arise related to todays visit.    Orders Placed This Encounter  Procedures   Influenza, MDCK, trivalent, PF(Flucelvax egg-free)   CBC with Differential/Platelet   CMP14+EGFR   Lipid Profile    B12 and Folate Panel   Vitamin D  (25 hydroxy)    Meds ordered this encounter  Medications   Vitamin D , Ergocalciferol , (DRISDOL ) 1.25 MG (50000 UNIT) CAPS capsule    Sig: Take 1 capsule (50,000 Units total) by mouth every 7 (seven) days.    Dispense:  12 capsule    Refill:  1    Return in about 6 months (around 06/28/2024) for F/U, PAP ONLY, Labs, Rafiq Bucklin PCP.   Total time spent:30 Minutes Time spent includes review of chart, medications, test results, and follow up plan with the patient.   [place] Controlled Substance Database was reviewed by me.  This patient was seen by Mardy Maxin, FNP-C in collaboration with Dr. Sigrid Bathe as a part of collaborative care agreement.  Ziare Cryder R. Maxin, MSN, FNP-C Internal medicine

## 2024-01-23 ENCOUNTER — Inpatient Hospital Stay: Admission: RE | Admit: 2024-01-23 | Source: Ambulatory Visit

## 2024-06-29 ENCOUNTER — Ambulatory Visit: Admitting: Nurse Practitioner

## 2024-12-30 ENCOUNTER — Encounter: Admitting: Nurse Practitioner
